# Patient Record
Sex: Male | Born: 1955
Health system: Southern US, Community
[De-identification: ages and names within clinical notes are randomized; demographics above are authoritative.]

## PROBLEM LIST (undated history)

## (undated) DIAGNOSIS — N189 Chronic kidney disease, unspecified: Secondary | ICD-10-CM

## (undated) DIAGNOSIS — R42 Dizziness and giddiness: Secondary | ICD-10-CM

---

## 2007-09-09 ENCOUNTER — Emergency Department: Payer: Self-pay | Admitting: Emergency Medicine

## 2008-12-24 ENCOUNTER — Encounter: Payer: Self-pay | Admitting: Internal Medicine

## 2009-01-06 ENCOUNTER — Encounter: Payer: Self-pay | Admitting: Internal Medicine

## 2009-02-06 ENCOUNTER — Encounter: Payer: Self-pay | Admitting: Internal Medicine

## 2020-04-24 ENCOUNTER — Encounter: Payer: Self-pay | Admitting: Family Medicine

## 2020-04-24 ENCOUNTER — Ambulatory Visit (INDEPENDENT_AMBULATORY_CARE_PROVIDER_SITE_OTHER): Payer: Medicare HMO | Admitting: Family Medicine

## 2020-04-24 ENCOUNTER — Other Ambulatory Visit: Payer: Self-pay

## 2020-04-24 VITALS — BP 150/86 | HR 81 | Ht 79.0 in | Wt 272.0 lb

## 2020-04-24 DIAGNOSIS — Z7689 Persons encountering health services in other specified circumstances: Secondary | ICD-10-CM

## 2020-04-24 DIAGNOSIS — I10 Essential (primary) hypertension: Secondary | ICD-10-CM | POA: Diagnosis not present

## 2020-04-24 MED ORDER — CHLORTHALIDONE 25 MG PO TABS
25.0000 mg | ORAL_TABLET | Freq: Every day | ORAL | 0 refills | Status: DC
Start: 2020-04-24 — End: 2020-06-05

## 2020-04-24 NOTE — Progress Notes (Signed)
Date:  04/24/2020   Name:  Austin Dudley   DOB:  January 02, 1956   MRN:  [id number]   Chief Complaint: Establish Care (needs physical )  Patient is a 64 year old male who presents for a establish care exam. The patient reports the following problems: up to date.[facility] maintenance has been reviewed up to date.   No results found for: CREATININE, BUN, NA, K, CL, CO2 No results found for: CHOL, HDL, LDLCALC, LDLDIRECT, TRIG, CHOLHDL No results found for: TSH No results found for: HGBA1C No results found for: WBC, HGB, HCT, MCV, PLT No results found for: ALT, AST, GGT, ALKPHOS, BILITOT   Review of Systems  Constitutional: Negative for chills and fever.  HENT: Negative for drooling, ear discharge, ear pain and sore throat.   Respiratory: Negative for cough, shortness of breath and wheezing.   Cardiovascular: Negative for chest pain, palpitations and leg swelling.  Gastrointestinal: Negative for abdominal pain, blood in stool, constipation, diarrhea and nausea.  Endocrine: Negative for polydipsia.  Genitourinary: Negative for dysuria, frequency, hematuria and urgency.  Musculoskeletal: Negative for back pain, myalgias and neck pain.  Skin: Negative for rash.  Allergic/Immunologic: Negative for environmental allergies.  Neurological: Negative for dizziness and headaches.  Hematological: Does not bruise/bleed easily.  Psychiatric/Behavioral: Negative for suicidal ideas. The patient is not nervous/anxious.     There are no problems to display for this patient.   Allergies  Allergen Reactions  . Amoxicillin Rash    History reviewed. No pertinent surgical history.  Social History   Tobacco Use  . Smoking status: Current Every Day Smoker    Packs/day: 1.00    Years: 20.00    Pack years: 20.00    Types: Cigarettes  . Smokeless tobacco: Never Used  Vaping Use  . Vaping Use: Never used  Substance Use Topics  . Alcohol use: Not Currently  . Drug use: Not Currently      Medication list has been reviewed and updated.  No outpatient medications have been marked as taking for the 04/24/20 encounter (Office Visit) with Juline Patch, MD.    [facility] 2/9 Scores 04/24/2020  PHQ - 2 Score 0  PHQ- 9 Score 0    GAD 7 : Generalized Anxiety Score 04/24/2020  Nervous, Anxious, on Edge 0  Control/stop worrying 0  Worry too much - different things 0  Trouble relaxing 0  Restless 0  Easily annoyed or irritable 0  Afraid - awful might happen 0  Total GAD 7 Score 0  Anxiety Difficulty Not difficult at all    BP Readings from Last 3 Encounters:  04/24/20 (!) 150/86    Physical Exam Vitals and nursing note reviewed.  Constitutional:      Appearance: Normal appearance. He is well-developed and overweight.  HENT:     Head: Normocephalic.     Right Ear: Tympanic membrane, ear canal and external ear normal. There is no impacted cerumen.     Left Ear: Tympanic membrane, ear canal and external ear normal. There is no impacted cerumen.     Nose: Nose normal.     Mouth/Throat:     Mouth: Mucous membranes are moist.  Eyes:     General: No scleral icterus.       Right eye: No discharge.        Left eye: No discharge.     Conjunctiva/sclera: Conjunctivae normal.     Pupils: Pupils are equal, round, and reactive to light.  Neck:  Thyroid: No thyromegaly.     Vascular: No JVD.     Trachea: No tracheal deviation.  Cardiovascular:     Rate and Rhythm: Normal rate and regular rhythm.     Heart sounds: Normal heart sounds. No murmur heard.  No friction rub. No gallop.   Pulmonary:     Effort: No respiratory distress.     Breath sounds: Normal breath sounds. No wheezing, rhonchi or rales.  Abdominal:     General: Bowel sounds are normal. There is no distension.     Palpations: Abdomen is soft. There is no mass.     Tenderness: There is no abdominal tenderness. There is no right CVA tenderness, left CVA tenderness, guarding or rebound.     Hernia: No hernia  is present.  Musculoskeletal:        General: No tenderness. Normal range of motion.     Cervical back: Normal range of motion and neck supple.  Lymphadenopathy:     Cervical: No cervical adenopathy.  Skin:    General: Skin is warm.     Findings: No rash.  Neurological:     Mental Status: He is alert and oriented to person, place, and time.     Cranial Nerves: No cranial nerve deficit.     Deep Tendon Reflexes: Reflexes are normal and symmetric.     Wt Readings from Last 3 Encounters:  04/24/20 272 lb (123.4 kg)    BP (!) 150/86   Pulse 81   Ht 6\' 7"  (2.007 m)   Wt 272 lb (123.4 kg)   SpO2 99%   BMI 30.64 kg/m   Assessment and Plan:   1. Establishing care with new doctor, encounter for Patient presents today to establish care with new physician. Patient used to see a physician in the [place] area who has since retired. Otherwise most of his medical care has been "on the road ". Patient's chart was reviewed but there is no information. Patient states he is always had some elevated blood pressure readings.  2. Essential hypertension Patient states he is going to need a DOT physical and it has been noted that blood pressure has been a concern in the past. Today's reading is elevated at 150/86. We will initiate chlorthalidone at 25 mg once a day. And patient has been instructed to return in 6 weeks for recheck of blood pressure. Patient has been given information to go for his DOT physical next care urgent care on [address]

## 2020-06-05 ENCOUNTER — Encounter: Payer: Self-pay | Admitting: Family Medicine

## 2020-06-05 ENCOUNTER — Ambulatory Visit (INDEPENDENT_AMBULATORY_CARE_PROVIDER_SITE_OTHER): Payer: Medicare HMO | Admitting: Family Medicine

## 2020-06-05 ENCOUNTER — Other Ambulatory Visit: Payer: Self-pay

## 2020-06-05 VITALS — BP 120/62 | HR 80 | Ht 79.0 in | Wt 277.0 lb

## 2020-06-05 DIAGNOSIS — F17210 Nicotine dependence, cigarettes, uncomplicated: Secondary | ICD-10-CM | POA: Diagnosis not present

## 2020-06-05 DIAGNOSIS — E669 Obesity, unspecified: Secondary | ICD-10-CM

## 2020-06-05 DIAGNOSIS — Z1211 Encounter for screening for malignant neoplasm of colon: Secondary | ICD-10-CM

## 2020-06-05 DIAGNOSIS — I10 Essential (primary) hypertension: Secondary | ICD-10-CM | POA: Diagnosis not present

## 2020-06-05 DIAGNOSIS — Z122 Encounter for screening for malignant neoplasm of respiratory organs: Secondary | ICD-10-CM | POA: Diagnosis not present

## 2020-06-05 MED ORDER — CHLORTHALIDONE 25 MG PO TABS: 25.0000 mg | ORAL_TABLET | Freq: Every day | ORAL | 1 refills | Status: DC

## 2020-06-05 NOTE — Progress Notes (Signed)
Date:  06/05/2020   Name:  Austin Dudley   DOB:  [birth year]   MRN:  [id number]   Chief Complaint: Hypertension (follow up starting b/p med)  Hypertension This is a chronic problem. The current episode started more than 1 year ago. The problem has been gradually improving since onset. The problem is controlled. Pertinent negatives include no anxiety, chest pain, headaches, neck pain, palpitations, peripheral edema, shortness of breath or sweats. There are no associated agents to hypertension.    No results found for: CREATININE, BUN, NA, K, CL, CO2 No results found for: CHOL, HDL, LDLCALC, LDLDIRECT, TRIG, CHOLHDL No results found for: TSH No results found for: HGBA1C No results found for: WBC, HGB, HCT, MCV, PLT No results found for: ALT, AST, GGT, ALKPHOS, BILITOT   Review of Systems  Constitutional: Negative for chills and fever.  HENT: Negative for drooling, ear discharge, ear pain and sore throat.   Respiratory: Negative for cough, shortness of breath and wheezing.   Cardiovascular: Negative for chest pain, palpitations and leg swelling.  Gastrointestinal: Negative for abdominal pain, blood in stool, constipation, diarrhea and nausea.  Endocrine: Negative for polydipsia.  Genitourinary: Negative for dysuria, frequency, hematuria and urgency.  Musculoskeletal: Negative for back pain, myalgias and neck pain.  Skin: Negative for rash.  Allergic/Immunologic: Negative for environmental allergies.  Neurological: Negative for dizziness and headaches.  Hematological: Does not bruise/bleed easily.  Psychiatric/Behavioral: Negative for suicidal ideas. The patient is not nervous/anxious.     There are no problems to display for this patient.   Allergies  Allergen Reactions  . Amoxicillin Rash    No past surgical history on file.  Social History   Tobacco Use  . Smoking status: Current Every Day Smoker    Packs/day: 1.00    Years: 20.00    Pack years: 20.00    Types:  Cigarettes  . Smokeless tobacco: Never Used  Vaping Use  . Vaping Use: Never used  Substance Use Topics  . Alcohol use: Not Currently  . Drug use: Not Currently     Medication list has been reviewed and updated.  Current Meds  Medication Sig  . chlorthalidone (HYGROTON) 25 MG tablet Take 1 tablet (25 mg total) by mouth daily.    PHQ 2/9 Scores 04/24/2020  PHQ - 2 Score 0  PHQ- 9 Score 0    GAD 7 : Generalized Anxiety Score 04/24/2020  Nervous, Anxious, on Edge 0  Control/stop worrying 0  Worry too much - different things 0  Trouble relaxing 0  Restless 0  Easily annoyed or irritable 0  Afraid - awful might happen 0  Total GAD 7 Score 0  Anxiety Difficulty Not difficult at all    BP Readings from Last 3 Encounters:  06/05/20 (!) 120/62  04/24/20 (!) 150/86    Physical Exam Vitals and nursing note reviewed.  HENT:     Head: Normocephalic.     Right Ear: Tympanic membrane and external ear normal.     Left Ear: Tympanic membrane and external ear normal.     Nose: Nose normal.  Eyes:     General: No scleral icterus.       Right eye: No discharge.        Left eye: No discharge.     Conjunctiva/sclera: Conjunctivae normal.     Pupils: Pupils are equal, round, and reactive to light.  Neck:     Thyroid: No thyromegaly.     Vascular: No JVD.  Trachea: No tracheal deviation.  Cardiovascular:     Rate and Rhythm: Normal rate and regular rhythm.     Heart sounds: Normal heart sounds. No murmur heard.  No friction rub. No gallop.   Pulmonary:     Effort: No respiratory distress.     Breath sounds: Normal breath sounds. No wheezing or rales.  Abdominal:     General: Bowel sounds are normal.     Palpations: Abdomen is soft. There is no mass.     Tenderness: There is no abdominal tenderness. There is no right CVA tenderness, left CVA tenderness, guarding or rebound.  Musculoskeletal:        General: No tenderness. Normal range of motion.     Cervical back: Normal  range of motion and neck supple.  Lymphadenopathy:     Cervical: No cervical adenopathy.  Skin:    General: Skin is warm.     Findings: No rash.  Neurological:     Mental Status: He is alert and oriented to person, place, and time.     Cranial Nerves: No cranial nerve deficit.     Deep Tendon Reflexes: Reflexes are normal and symmetric.     Wt Readings from Last 3 Encounters:  06/05/20 (!) 277 lb (125.6 kg)  04/24/20 272 lb (123.4 kg)    BP (!) 120/62   Pulse 80   Ht 6\' 7"  (2.007 m)   Wt (!) 277 lb (125.6 kg)   BMI 31.21 kg/m   Assessment and Plan:  1. Essential hypertension Chronic.  Controlled.  Stable.  Uncomplicated.  We will continue chlorthalidone 25 mg once a day.  Patient was also given a Dash diet. - chlorthalidone (HYGROTON) 25 MG tablet; Take 1 tablet (25 mg total) by mouth daily.  Dispense: 90 tablet; Refill: 1 - Renal Function Panel  2. Obesity (BMI 30.0-34.9) Health risks of being over weight were discussed and patient was counseled on weight loss options and exercise.  Patient was given a Dash diet for weight loss and for blood pressure control. - Lipid Panel With LDL/HDL Ratio  3. Cigarette nicotine dependence without complication Patient has been advised of the health risks of smoking and counseled concerning cessation of tobacco products. I spent over 3 minutes for discussion and to answer questions.  Patient has been scheduled for low-dose CT scanning of the lung  4. Encounter for screening for lung cancer Referral was placed for [facility] for evaluation with low-dose CT of the lungs for cancer concerns.  5. Colon cancer screening Discussed with patient and referral made to gastroenterology for screening colonoscopy. - Ambulatory referral to Gastroenterology

## 2020-06-05 NOTE — Patient Instructions (Signed)
DASH Eating Plan DASH stands for "Dietary Approaches to Stop Hypertension." The DASH eating plan is a healthy eating plan that has been shown to reduce high blood pressure (hypertension). It may also reduce your risk for type 2 diabetes, heart disease, and stroke. The DASH eating plan may also help with weight loss. What are tips for following this plan?  General guidelines  Avoid eating more than 2,300 mg (milligrams) of salt (sodium) a day. If you have hypertension, you may need to reduce your sodium intake to 1,500 mg a day.  Limit alcohol intake to no more than 1 drink a day for nonpregnant women and 2 drinks a day for men. One drink equals 12 oz of beer, 5 oz of wine, or 1 oz of hard liquor.  Work with your health care provider to maintain a healthy body weight or to lose weight. Ask what an ideal weight is for you.  Get at least 30 minutes of exercise that causes your heart to beat faster (aerobic exercise) most days of the week. Activities may include walking, swimming, or biking.  Work with your health care provider or diet and nutrition specialist (dietitian) to adjust your eating plan to your individual calorie needs. Reading food labels   Check food labels for the amount of sodium per serving. Choose foods with less than 5 percent of the Daily Value of sodium. Generally, foods with less than 300 mg of sodium per serving fit into this eating plan.  To find whole grains, look for the word "whole" as the first word in the ingredient list. Shopping  Buy products labeled as "low-sodium" or "no salt added."  Buy fresh foods. Avoid canned foods and premade or frozen meals. Cooking  Avoid adding salt when cooking. Use salt-free seasonings or herbs instead of table salt or sea salt. Check with your health care provider or pharmacist before using salt substitutes.  Do not fry foods. Cook foods using healthy methods such as baking, boiling, grilling, and broiling instead.  Cook with  heart-healthy oils, such as olive, canola, soybean, or sunflower oil. Meal planning  Eat a balanced diet that includes: ? 5 or more servings of fruits and vegetables each day. At each meal, try to fill half of your plate with fruits and vegetables. ? Up to 6-8 servings of whole grains each day. ? Less than 6 oz of lean meat, poultry, or fish each day. A 3-oz serving of meat is about the same size as a deck of cards. One egg equals 1 oz. ? 2 servings of low-fat dairy each day. ? A serving of nuts, seeds, or beans 5 times each week. ? Heart-healthy fats. Healthy fats called Omega-3 fatty acids are found in foods such as flaxseeds and coldwater fish, like sardines, salmon, and mackerel.  Limit how much you eat of the following: ? Canned or prepackaged foods. ? Food that is high in trans fat, such as fried foods. ? Food that is high in saturated fat, such as fatty meat. ? Sweets, desserts, sugary drinks, and other foods with added sugar. ? Full-fat dairy products.  Do not salt foods before eating.  Try to eat at least 2 vegetarian meals each week.  Eat more home-cooked food and less restaurant, buffet, and fast food.  When eating at a restaurant, ask that your food be prepared with less salt or no salt, if possible. What foods are recommended? The items listed may not be a complete list. Talk with your dietitian about   what dietary choices are best for you. Grains Whole-grain or whole-wheat bread. Whole-grain or whole-wheat pasta. Brown rice. Oatmeal. Quinoa. Bulgur. Whole-grain and low-sodium cereals. Pita bread. Low-fat, low-sodium crackers. Whole-wheat flour tortillas. Vegetables Fresh or frozen vegetables (raw, steamed, roasted, or grilled). Low-sodium or reduced-sodium tomato and vegetable juice. Low-sodium or reduced-sodium tomato sauce and tomato paste. Low-sodium or reduced-sodium canned vegetables. Fruits All fresh, dried, or frozen fruit. Canned fruit in natural juice (without  added sugar). Meat and other protein foods Skinless chicken or turkey. Ground chicken or turkey. Pork with fat trimmed off. Fish and seafood. Egg whites. Dried beans, peas, or lentils. Unsalted nuts, nut butters, and seeds. Unsalted canned beans. Lean cuts of beef with fat trimmed off. Low-sodium, lean deli meat. Dairy Low-fat (1%) or fat-free (skim) milk. Fat-free, low-fat, or reduced-fat cheeses. Nonfat, low-sodium ricotta or cottage cheese. Low-fat or nonfat yogurt. Low-fat, low-sodium cheese. Fats and oils Soft margarine without trans fats. Vegetable oil. Low-fat, reduced-fat, or light mayonnaise and salad dressings (reduced-sodium). Canola, safflower, olive, soybean, and sunflower oils. Avocado. Seasoning and other foods Herbs. Spices. Seasoning mixes without salt. Unsalted popcorn and pretzels. Fat-free sweets. What foods are not recommended? The items listed may not be a complete list. Talk with your dietitian about what dietary choices are best for you. Grains Baked goods made with fat, such as croissants, muffins, or some breads. Dry pasta or rice meal packs. Vegetables Creamed or fried vegetables. Vegetables in a cheese sauce. Regular canned vegetables (not low-sodium or reduced-sodium). Regular canned tomato sauce and paste (not low-sodium or reduced-sodium). Regular tomato and vegetable juice (not low-sodium or reduced-sodium). Pickles. Olives. Fruits Canned fruit in a light or heavy syrup. Fried fruit. Fruit in cream or butter sauce. Meat and other protein foods Fatty cuts of meat. Ribs. Fried meat. Bacon. Sausage. Bologna and other processed lunch meats. Salami. Fatback. Hotdogs. Bratwurst. Salted nuts and seeds. Canned beans with added salt. Canned or smoked fish. Whole eggs or egg yolks. Chicken or turkey with skin. Dairy Whole or 2% milk, cream, and half-and-half. Whole or full-fat cream cheese. Whole-fat or sweetened yogurt. Full-fat cheese. Nondairy creamers. Whipped toppings.  Processed cheese and cheese spreads. Fats and oils Butter. Stick margarine. Lard. Shortening. Ghee. Bacon fat. Tropical oils, such as coconut, palm kernel, or palm oil. Seasoning and other foods Salted popcorn and pretzels. Onion salt, garlic salt, seasoned salt, table salt, and sea salt. Worcestershire sauce. Tartar sauce. Barbecue sauce. Teriyaki sauce. Soy sauce, including reduced-sodium. Steak sauce. Canned and packaged gravies. Fish sauce. Oyster sauce. Cocktail sauce. Horseradish that you find on the shelf. Ketchup. Mustard. Meat flavorings and tenderizers. Bouillon cubes. Hot sauce and Tabasco sauce. Premade or packaged marinades. Premade or packaged taco seasonings. Relishes. Regular salad dressings. Where to find more information:  National Heart, Lung, and Blood Institute: www.nhlbi.nih.gov  American Heart Association: www.heart.org Summary  The DASH eating plan is a healthy eating plan that has been shown to reduce high blood pressure (hypertension). It may also reduce your risk for type 2 diabetes, heart disease, and stroke.  With the DASH eating plan, you should limit salt (sodium) intake to 2,300 mg a day. If you have hypertension, you may need to reduce your sodium intake to 1,500 mg a day.  When on the DASH eating plan, aim to eat more fresh fruits and vegetables, whole grains, lean proteins, low-fat dairy, and heart-healthy fats.  Work with your health care provider or diet and nutrition specialist (dietitian) to adjust your eating plan to your   individual calorie needs. This information is not intended to replace advice given to you by your health care provider. Make sure you discuss any questions you have with your health care provider. Document Revised: 10/07/2017 Document Reviewed: 10/18/2016 Elsevier Patient Education  2020 [employer].  

## 2020-06-06 LAB — RENAL FUNCTION PANEL
Albumin: 4.4 g/dL (ref 3.8–4.8)
BUN/Creatinine Ratio: 10 (ref 10–24)
BUN: 23 mg/dL (ref 8–27)
CO2: 25 mmol/L (ref 20–29)
Calcium: 9.8 mg/dL (ref 8.6–10.2)
Chloride: 93 mmol/L — ABNORMAL LOW (ref 96–106)
Creatinine, Ser: 2.26 mg/dL — ABNORMAL HIGH (ref 0.76–1.27)
GFR calc Af Amer: 34 mL/min/{1.73_m2} — ABNORMAL LOW (ref >59)
GFR calc non Af Amer: 29 mL/min/{1.73_m2} — ABNORMAL LOW (ref >59)
Glucose: 177 mg/dL — ABNORMAL HIGH (ref 65–99)
Phosphorus: 2.1 mg/dL — ABNORMAL LOW (ref 2.8–4.1)
Potassium: 3.4 mmol/L — ABNORMAL LOW (ref 3.5–5.2)
Sodium: 137 mmol/L (ref 134–144)

## 2020-06-06 LAB — LIPID PANEL WITH LDL/HDL RATIO
Cholesterol, Total: 178 mg/dL (ref 100–199)
HDL: 30 mg/dL — ABNORMAL LOW (ref >39)
LDL Chol Calc (NIH): 123 mg/dL — ABNORMAL HIGH (ref 0–99)
LDL/HDL Ratio: 4.1 ratio — ABNORMAL HIGH (ref 0.0–3.6)
Triglycerides: 138 mg/dL (ref 0–149)
VLDL Cholesterol Cal: 25 mg/dL (ref 5–40)

## 2020-06-12 ENCOUNTER — Telehealth: Payer: Self-pay | Admitting: *Deleted

## 2020-06-12 NOTE — Telephone Encounter (Signed)
Received referral for low dose lung cancer screening CT scan. Message left at phone number listed in EMR for patient to call me back to facilitate scheduling scan.  

## 2020-07-07 ENCOUNTER — Telehealth: Payer: Self-pay

## 2020-07-07 ENCOUNTER — Telehealth: Payer: Medicare HMO

## 2020-07-07 NOTE — Telephone Encounter (Signed)
Patient missed his 9:30 nurse call to schedule his colonoscopy.  LVM for him to call office back to reschedule this call.  Thanks,  [place], [place]

## 2020-07-16 ENCOUNTER — Telehealth: Payer: Self-pay

## 2020-07-16 DIAGNOSIS — Z122 Encounter for screening for malignant neoplasm of respiratory organs: Secondary | ICD-10-CM

## 2020-07-16 DIAGNOSIS — Z87891 Personal history of nicotine dependence: Secondary | ICD-10-CM

## 2020-07-16 NOTE — Telephone Encounter (Signed)
Contacted patient for lung CT screening clinic after receiving referral from Dr. Otilio Miu.  Message left for patient to call Burgess Estelle, lung navigator to schedule CT scan.

## 2020-07-28 ENCOUNTER — Encounter: Payer: Self-pay | Admitting: Family Medicine

## 2020-07-28 ENCOUNTER — Ambulatory Visit (INDEPENDENT_AMBULATORY_CARE_PROVIDER_SITE_OTHER): Payer: Medicare HMO | Admitting: Family Medicine

## 2020-07-28 ENCOUNTER — Other Ambulatory Visit: Payer: Self-pay

## 2020-07-28 VITALS — BP 120/78 | HR 88 | Ht 78.0 in | Wt 258.0 lb

## 2020-07-28 DIAGNOSIS — R739 Hyperglycemia, unspecified: Secondary | ICD-10-CM

## 2020-07-28 DIAGNOSIS — R5381 Other malaise: Secondary | ICD-10-CM | POA: Diagnosis not present

## 2020-07-28 DIAGNOSIS — Z862 Personal history of diseases of the blood and blood-forming organs and certain disorders involving the immune mechanism: Secondary | ICD-10-CM

## 2020-07-28 DIAGNOSIS — R5383 Other fatigue: Secondary | ICD-10-CM

## 2020-07-28 DIAGNOSIS — R634 Abnormal weight loss: Secondary | ICD-10-CM

## 2020-07-28 DIAGNOSIS — F17210 Nicotine dependence, cigarettes, uncomplicated: Secondary | ICD-10-CM

## 2020-07-28 DIAGNOSIS — N1832 Chronic kidney disease, stage 3b: Secondary | ICD-10-CM | POA: Diagnosis not present

## 2020-07-28 LAB — HEMOCCULT GUIAC POC 1CARD (OFFICE): Fecal Occult Blood, POC: NEGATIVE

## 2020-07-28 LAB — POCT CBG (FASTING - GLUCOSE)-MANUAL ENTRY: Glucose Fasting, POC: 197 mg/dL — AB (ref 70–99)

## 2020-07-28 NOTE — Progress Notes (Signed)
Date:  07/28/2020   Name:  Austin Dudley   DOB:  May 13, 1956   MRN:  [id number]   Chief Complaint: Dizziness (weight loss of 20 pounds since july- not trying. Feels bad, not sleeping, no appetite, gets dizzy when trying to do anything)  Dizziness This is a new problem. The current episode started 1 to 4 weeks ago. The problem has been gradually worsening. Associated symptoms include a change in bowel habit, fatigue, nausea and vertigo. Pertinent negatives include no abdominal pain, chest pain, chills, coughing, diaphoresis, fever, headaches, myalgias, neck pain, rash or sore throat. Exacerbated by: lying down/vertigo. He has tried nothing for the symptoms.  Thyroid Problem Presents for initial (for malaise and fatigue) visit. Symptoms include fatigue and weight loss. Patient reports no anxiety, constipation, diaphoresis, diarrhea or palpitations.    Lab Results  Component Value Date   CREATININE 2.26 (H) 06/05/2020   BUN 23 06/05/2020   NA 137 06/05/2020   K 3.4 (L) 06/05/2020   CL 93 (L) 06/05/2020   CO2 25 06/05/2020   Lab Results  Component Value Date   CHOL 178 06/05/2020   HDL 30 (L) 06/05/2020   LDLCALC 123 (H) 06/05/2020   TRIG 138 06/05/2020   No results found for: TSH No results found for: HGBA1C No results found for: WBC, HGB, HCT, MCV, PLT No results found for: ALT, AST, GGT, ALKPHOS, BILITOT   Review of Systems  Constitutional: Positive for fatigue and weight loss. Negative for chills, diaphoresis and fever.  HENT: Negative for drooling, ear discharge, ear pain and sore throat.   Respiratory: Negative for cough, shortness of breath and wheezing.   Cardiovascular: Negative for chest pain, palpitations and leg swelling.  Gastrointestinal: Positive for change in bowel habit and nausea. Negative for abdominal distention, abdominal pain, anal bleeding, blood in stool, constipation, diarrhea and rectal pain.  Endocrine: Negative for polydipsia.  Genitourinary:  Negative for dysuria, frequency, hematuria and urgency.  Musculoskeletal: Negative for back pain, myalgias and neck pain.  Skin: Negative for color change, pallor and rash.  Allergic/Immunologic: Negative for environmental allergies.  Neurological: Positive for dizziness and vertigo. Negative for headaches.  Hematological: Negative for adenopathy. Does not bruise/bleed easily.  Psychiatric/Behavioral: Negative for suicidal ideas. The patient is not nervous/anxious.     There are no problems to display for this patient.   Allergies  Allergen Reactions   Amoxicillin Rash    No past surgical history on file.  Social History   Tobacco Use   Smoking status: Former Smoker    Packs/day: 1.00    Years: 20.00    Pack years: 20.00    Types: Cigarettes    Quit date: 06/27/2020    Years since quitting: 0.0   Smokeless tobacco: Never Used  Vaping Use   Vaping Use: Never used  Substance Use Topics   Alcohol use: Not Currently   Drug use: Not Currently     Medication list has been reviewed and updated.  Current Meds  Medication Sig   chlorthalidone (HYGROTON) 25 MG tablet Take 1 tablet (25 mg total) by mouth daily.    PHQ 2/9 Scores 07/28/2020 04/24/2020  PHQ - 2 Score 0 0  PHQ- 9 Score 7 0    GAD 7 : Generalized Anxiety Score 07/28/2020 04/24/2020  Nervous, Anxious, on Edge 0 0  Control/stop worrying 0 0  Worry too much - different things 0 0  Trouble relaxing 0 0  Restless 0 0  Easily annoyed or irritable  0 0  Afraid - awful might happen 0 0  Total GAD 7 Score 0 0  Anxiety Difficulty - Not difficult at all    BP Readings from Last 3 Encounters:  07/28/20 120/78  06/05/20 (!) 120/62  04/24/20 (!) 150/86    Physical Exam Vitals reviewed.  Constitutional:      Appearance: He is ill-appearing.  HENT:     Right Ear: Tympanic membrane and ear canal normal.     Left Ear: Tympanic membrane and ear canal normal.  Cardiovascular:     Rate and Rhythm: Normal  rate.     Pulses: Normal pulses.     Heart sounds: Normal heart sounds. No murmur heard.  No gallop.   Pulmonary:     Effort: Pulmonary effort is normal.     Breath sounds: Normal breath sounds. No wheezing or rhonchi.  Abdominal:     General: There is no distension.     Palpations: Abdomen is soft.     Tenderness: There is no abdominal tenderness.  Musculoskeletal:     Cervical back: Normal range of motion.  Skin:    General: Skin is warm.     Capillary Refill: Capillary refill takes less than 2 seconds.     Wt Readings from Last 3 Encounters:  07/28/20 258 lb (117 kg)  06/05/20 (!) 277 lb (125.6 kg)  04/24/20 272 lb (123.4 kg)    BP 120/78    Pulse 88    Ht 6\' 6"  (1.981 m)    Wt 258 lb (117 kg)    BMI 29.81 kg/m   Assessment and Plan: 1. Malaise and fatigue New onset.  Approximately 4 weeks.  Is been exceedingly difficult to contact patient because he does not answer his phone therefore he was not able to be contacted concerning need to see nephrology/A1c check/and lung cancer screening.  We will initiate an evaluation with renal function panel CBC thyroid panel with TSH hepatic panel and A1c.  Patient is to come back in 48 hours we will go over labs and not risk not missing causing the short future in the meantime we have made the following appointments and evaluations below. - Renal Function Panel - CBC with Differential/Platelet - Thyroid Panel With TSH - Hepatic Function Panel (6) - Hemoglobin A1c  2. Stage 3b chronic kidney disease Patient was noted to have a GFR of 39 consistent with CKD 3B.  We will recheck a renal function panel and in the meantime we have made an appointment for October with nephrology with Dr. Holley Raring. - Renal Function Panel - Ambulatory referral to Nephrology  3. Weight loss Since we last saw patient patient has lost another 20 pounds I suspect this may be due to uncontrolled diabetes that we have been trying to get patient to come in for  evaluation.  We will obtain an A1c and pending that we will either determine the direction will take for his diabetic approach. - Renal Function Panel - CBC with Differential/Platelet - Thyroid Panel With TSH - Hepatic Function Panel (6) - Hemoglobin A1c - POCT occult blood stool - POCT CBG (Fasting - Glucose)  4. Hyperglycemia Patient is noted to be in the hyperglycemic but less than 200 mg percent.  As noted we will recheck a glucose a day which was noted to be 197 mg percent.  This is not fasting but certainly and indicative of diabetic concern. - Renal Function Panel - Hemoglobin A1c - POCT CBG (Fasting - Glucose)  5.  History of anemia 2019 there was noted to be a mild decrease in hemoglobin but not hematocrit.  We will check a CBC.  We did a Hemoccult today and it was guaiac negative. - CBC with Differential/Platelet - POCT occult blood stool  6. Cigarette nicotine dependence without complication Patient has been advised of the health risks of smoking and counseled concerning cessation of tobacco products. I spent over 3 minutes for discussion and to answer questions.

## 2020-07-28 NOTE — Telephone Encounter (Signed)
PCP office contacted and assisted with scheduling:  Received referral for initial lung cancer screening scan. Contacted patient and obtained smoking history,(former, quit 06/27/20, 47 pack year) as well as answering questions related to screening process. Patient denies signs of lung cancer such as weight loss or hemoptysis. Patient denies comorbidity that would prevent curative treatment if lung cancer were found. Patient is scheduled for shared decision making visit and CT scan on 08/05/20 at 130pm.

## 2020-07-28 NOTE — Addendum Note (Signed)
Addended by: Lieutenant Diego on: 07/28/2020 10:43 AM   Modules accepted: Orders

## 2020-07-29 LAB — CBC WITH DIFFERENTIAL/PLATELET
Basophils Absolute: 0 10*3/uL (ref 0.0–0.2)
Basos: 0 %
EOS (ABSOLUTE): 0.1 10*3/uL (ref 0.0–0.4)
Eos: 1 %
Hematocrit: 35.5 % — ABNORMAL LOW (ref 37.5–51.0)
Hemoglobin: 12.8 g/dL — ABNORMAL LOW (ref 13.0–17.7)
Immature Grans (Abs): 0 10*3/uL (ref 0.0–0.1)
Immature Granulocytes: 0 %
Lymphocytes Absolute: 2.2 10*3/uL (ref 0.7–3.1)
Lymphs: 25 %
MCH: 30.6 pg (ref 26.6–33.0)
MCHC: 36.1 g/dL — ABNORMAL HIGH (ref 31.5–35.7)
MCV: 85 fL (ref 79–97)
Monocytes Absolute: 0.5 10*3/uL (ref 0.1–0.9)
Monocytes: 6 %
Neutrophils Absolute: 6 10*3/uL (ref 1.4–7.0)
Neutrophils: 68 %
Platelets: 302 10*3/uL (ref 150–450)
RBC: 4.18 x10E6/uL (ref 4.14–5.80)
RDW: 13.9 % (ref 11.6–15.4)
WBC: 8.9 10*3/uL (ref 3.4–10.8)

## 2020-07-29 LAB — RENAL FUNCTION PANEL
Albumin: 4.8 g/dL (ref 3.8–4.8)
BUN/Creatinine Ratio: 10 (ref 10–24)
BUN: 24 mg/dL (ref 8–27)
CO2: 27 mmol/L (ref 20–29)
Calcium: 9.9 mg/dL (ref 8.6–10.2)
Chloride: 88 mmol/L — ABNORMAL LOW (ref 96–106)
Creatinine, Ser: 2.47 mg/dL — ABNORMAL HIGH (ref 0.76–1.27)
GFR calc Af Amer: 30 mL/min/{1.73_m2} — ABNORMAL LOW (ref >59)
GFR calc non Af Amer: 26 mL/min/{1.73_m2} — ABNORMAL LOW (ref >59)
Glucose: 150 mg/dL — ABNORMAL HIGH (ref 65–99)
Phosphorus: 2 mg/dL — ABNORMAL LOW (ref 2.8–4.1)
Potassium: 3.3 mmol/L — ABNORMAL LOW (ref 3.5–5.2)
Sodium: 137 mmol/L (ref 134–144)

## 2020-07-29 LAB — THYROID PANEL WITH TSH
Free Thyroxine Index: 3.5 (ref 1.2–4.9)
T3 Uptake Ratio: 28 % (ref 24–39)
T4, Total: 12.4 ug/dL — ABNORMAL HIGH (ref 4.5–12.0)
TSH: 1.82 u[IU]/mL (ref 0.450–4.500)

## 2020-07-29 LAB — HEPATIC FUNCTION PANEL (6)
ALT: 25 IU/L (ref 0–44)
AST: 30 IU/L (ref 0–40)
Alkaline Phosphatase: 79 IU/L (ref 44–121)
Bilirubin Total: 1.1 mg/dL (ref 0.0–1.2)
Bilirubin, Direct: 0.42 mg/dL — ABNORMAL HIGH (ref 0.00–0.40)

## 2020-07-29 LAB — HEMOGLOBIN A1C
Est. average glucose Bld gHb Est-mCnc: 137 mg/dL
Hgb A1c MFr Bld: 6.4 % — ABNORMAL HIGH (ref 4.8–5.6)

## 2020-07-30 ENCOUNTER — Other Ambulatory Visit: Payer: Self-pay

## 2020-07-30 ENCOUNTER — Encounter: Payer: Self-pay | Admitting: Family Medicine

## 2020-07-30 ENCOUNTER — Ambulatory Visit (INDEPENDENT_AMBULATORY_CARE_PROVIDER_SITE_OTHER): Payer: Medicare HMO | Admitting: Family Medicine

## 2020-07-30 ENCOUNTER — Telehealth: Payer: Self-pay

## 2020-07-30 VITALS — BP 120/78 | HR 88 | Ht 78.0 in | Wt 259.0 lb

## 2020-07-30 DIAGNOSIS — R0600 Dyspnea, unspecified: Secondary | ICD-10-CM | POA: Diagnosis not present

## 2020-07-30 DIAGNOSIS — F17210 Nicotine dependence, cigarettes, uncomplicated: Secondary | ICD-10-CM

## 2020-07-30 DIAGNOSIS — R5383 Other fatigue: Secondary | ICD-10-CM | POA: Diagnosis not present

## 2020-07-30 DIAGNOSIS — R634 Abnormal weight loss: Secondary | ICD-10-CM

## 2020-07-30 DIAGNOSIS — R5381 Other malaise: Secondary | ICD-10-CM

## 2020-07-30 DIAGNOSIS — R0609 Other forms of dyspnea: Secondary | ICD-10-CM

## 2020-07-30 DIAGNOSIS — N1832 Chronic kidney disease, stage 3b: Secondary | ICD-10-CM

## 2020-07-30 MED ORDER — NICOTINE 21 MG/24HR TD PT24: 21.0000 mg | MEDICATED_PATCH | Freq: Every day | TRANSDERMAL | 0 refills | Status: DC

## 2020-07-30 NOTE — Telephone Encounter (Signed)
Returned patients call.  No answer.  Unable to leave voicemail.

## 2020-07-30 NOTE — Progress Notes (Signed)
Date:  07/30/2020   Name:  Austin Dudley   DOB:  [birth year]   MRN:  [id number]   Chief Complaint: Follow-up (not taking chlorthaladone/ prediabetes)  Patient is a 64 year old male who presents for a followup exam for malaise/fatigue/weight loss. The patient reports the following problems: seemly better. Health maintenance has been reviewed studies as noted.   Lab Results  Component Value Date   CREATININE 2.47 (H) 07/28/2020   BUN 24 07/28/2020   NA 137 07/28/2020   K 3.3 (L) 07/28/2020   CL 88 (L) 07/28/2020   CO2 27 07/28/2020   Lab Results  Component Value Date   CHOL 178 06/05/2020   HDL 30 (L) 06/05/2020   LDLCALC 123 (H) 06/05/2020   TRIG 138 06/05/2020   Lab Results  Component Value Date   TSH 1.820 07/28/2020   Lab Results  Component Value Date   HGBA1C 6.4 (H) 07/28/2020   Lab Results  Component Value Date   WBC 8.9 07/28/2020   HGB 12.8 (L) 07/28/2020   HCT 35.5 (L) 07/28/2020   MCV 85 07/28/2020   PLT 302 07/28/2020   Lab Results  Component Value Date   ALT 25 07/28/2020   AST 30 07/28/2020   ALKPHOS 79 07/28/2020   BILITOT 1.1 07/28/2020     Review of Systems  Constitutional: Negative for chills and fever.  HENT: Negative for drooling, ear discharge, ear pain and sore throat.   Respiratory: Negative for cough, shortness of breath and wheezing.   Cardiovascular: Negative for chest pain, palpitations and leg swelling.  Gastrointestinal: Negative for abdominal pain, blood in stool, constipation, diarrhea and nausea.  Endocrine: Negative for polydipsia.  Genitourinary: Negative for dysuria, frequency, hematuria and urgency.  Musculoskeletal: Negative for back pain, myalgias and neck pain.  Skin: Negative for rash.  Allergic/Immunologic: Negative for environmental allergies.  Neurological: Negative for dizziness and headaches.  Hematological: Does not bruise/bleed easily.  Psychiatric/Behavioral: Negative for suicidal ideas. The patient is not  nervous/anxious.     There are no problems to display for this patient.   Allergies  Allergen Reactions  . Amoxicillin Rash    No past surgical history on file.  Social History   Tobacco Use  . Smoking status: Former Smoker    Packs/day: 1.00    Years: 20.00    Pack years: 20.00    Types: Cigarettes    Quit date: 06/27/2020    Years since quitting: 0.0  . Smokeless tobacco: Never Used  Vaping Use  . Vaping Use: Never used  Substance Use Topics  . Alcohol use: Not Currently  . Drug use: Not Currently     Medication list has been reviewed and updated.  No outpatient medications have been marked as taking for the 07/30/20 encounter (Office Visit) with Juline Patch, MD.    [facility] 2/9 Scores 07/28/2020 04/24/2020  PHQ - 2 Score 0 0  PHQ- 9 Score 7 0    GAD 7 : Generalized Anxiety Score 07/28/2020 04/24/2020  Nervous, Anxious, on Edge 0 0  Control/stop worrying 0 0  Worry too much - different things 0 0  Trouble relaxing 0 0  Restless 0 0  Easily annoyed or irritable 0 0  Afraid - awful might happen 0 0  Total GAD 7 Score 0 0  Anxiety Difficulty - Not difficult at all    BP Readings from Last 3 Encounters:  07/30/20 120/78  07/28/20 120/78  06/05/20 (!) 120/62  Physical Exam Vitals and nursing note reviewed.  HENT:     Head: Normocephalic.     Right Ear: Tympanic membrane and external ear normal.     Left Ear: Tympanic membrane and external ear normal.     Nose: Nose normal.     Mouth/Throat:     Mouth: Mucous membranes are dry.  Eyes:     General: No scleral icterus.       Right eye: No discharge.        Left eye: No discharge.     Conjunctiva/sclera: Conjunctivae normal.     Pupils: Pupils are equal, round, and reactive to light.  Neck:     Thyroid: No thyromegaly.     Vascular: No JVD.     Trachea: No tracheal deviation.  Cardiovascular:     Rate and Rhythm: Normal rate and regular rhythm.     Heart sounds: Normal heart sounds. No murmur  heard.  No friction rub. No gallop.   Pulmonary:     Effort: No respiratory distress.     Breath sounds: Normal breath sounds. No wheezing or rales.  Abdominal:     General: Bowel sounds are normal.     Palpations: Abdomen is soft. There is no mass.     Tenderness: There is no abdominal tenderness. There is no guarding or rebound.  Musculoskeletal:        General: No tenderness. Normal range of motion.     Cervical back: Normal range of motion and neck supple.  Lymphadenopathy:     Cervical: No cervical adenopathy.  Skin:    General: Skin is warm.     Findings: No rash.  Neurological:     Mental Status: He is alert and oriented to person, place, and time.     Cranial Nerves: No cranial nerve deficit.     Deep Tendon Reflexes: Reflexes are normal and symmetric.     Wt Readings from Last 3 Encounters:  07/30/20 259 lb (117.5 kg)  07/28/20 258 lb (117 kg)  06/05/20 (!) 277 lb (125.6 kg)    BP 120/78   Pulse 88   Ht 6\' 6"  (1.981 m)   Wt 259 lb (117.5 kg)   BMI 29.93 kg/m   Assessment and Plan:  1. Malaise and fatigue Follow-up for malaise and fatigue earlier in the week in which we reviewed labs and patient is having some diabetic but had a very mild and seems to be controlled with diet,.  It was noted that there was a decrease in his GFR but I am beginning to think this may be prerenal due to the chlorthalidone use for blood pressure control.  2. Weight loss Patient had a 20 pound weight loss while on chlorthalidone.  Patient had a very good diuresis but this may have caused some prerenal concerns.  Blood pressure is doing well currently at 120/78.  Patient's been encouraged to drink more fluids such as have cut Gatorade.  We will recheck blood pressure in 6 weeks.  3. Cigarette nicotine dependence without complication Desires help with smoking cessation.  We have discussed nicotine patches and will start with 21 mg per 24-hour patch. - nicotine (NICODERM CQ - DOSED IN  MG/24 HOURS) 21 mg/24hr patch; Place 1 patch (21 mg total) onto the skin daily.  Dispense: 28 patch; Refill: 0  4. DOE (dyspnea on exertion) Patient is noted to had some dyspnea on exertion either from fatigue or other means.  We obtained an EKG to get  a baseline.  EKG reading was as follows.I have reviewed EKG which shows pulse rate 92 and regular intervals normal except for QRS which was increased to .136.  This is consistent with a right bundle branch block for which we have no comparison.  There is no LVH by EKG criteria noted.  There is no ischemic changes except for some nonspecific T wave abnormalities which is associated with the right bundle branch block... Comparison to previous EKG dated not applicable.  Currently did not feel like there is a ischemic cause and we will keep a watch on this. - EKG 12-Lead  5. Stage 3b chronic kidney disease As noted patient's GFR decreased on last visit.  Patient has increased his weight by 1 pound likely due to stopping the chlorthalidone.  We will recheck a BUN and creatinine to see if there is been some improvement in his renal function since cessation of diuresis. - BUN+Creat

## 2020-07-31 LAB — BUN+CREAT
BUN/Creatinine Ratio: 10 (ref 10–24)
BUN: 23 mg/dL (ref 8–27)
Creatinine, Ser: 2.41 mg/dL — ABNORMAL HIGH (ref 0.76–1.27)
GFR calc Af Amer: 31 mL/min/{1.73_m2} — ABNORMAL LOW (ref >59)
GFR calc non Af Amer: 27 mL/min/{1.73_m2} — ABNORMAL LOW (ref >59)

## 2020-08-05 ENCOUNTER — Ambulatory Visit
Admission: RE | Admit: 2020-08-05 | Discharge: 2020-08-05 | Disposition: A | Discharge status: Home / Self Care | Payer: Medicare HMO | Source: Ambulatory Visit | Attending: Nurse Practitioner | Admitting: Nurse Practitioner

## 2020-08-05 ENCOUNTER — Other Ambulatory Visit: Payer: Self-pay

## 2020-08-05 ENCOUNTER — Encounter: Payer: Self-pay | Admitting: Nurse Practitioner

## 2020-08-05 ENCOUNTER — Inpatient Hospital Stay: Payer: Medicare HMO | Attending: Nurse Practitioner | Admitting: Nurse Practitioner

## 2020-08-05 DIAGNOSIS — Z122 Encounter for screening for malignant neoplasm of respiratory organs: Secondary | ICD-10-CM | POA: Diagnosis present

## 2020-08-05 DIAGNOSIS — Z87891 Personal history of nicotine dependence: Secondary | ICD-10-CM

## 2020-08-05 NOTE — Progress Notes (Signed)
Virtual Visit via Video Enabled Telemedicine Note   I connected with Braxxton Stoudt on 08/05/20 at 1:45 PM EST by video enabled telemedicine visit and verified that I am speaking with the correct person using two identifiers.   I discussed the limitations, risks, security and privacy concerns of performing an evaluation and management service by telemedicine and the availability of in-person appointments. I also discussed with the patient that there may be a patient responsible charge related to this service. The patient expressed understanding and agreed to proceed.   Other persons participating in the visit and their role in the encounter: Burgess Estelle, RN- checking in patient & navigation  Patient's location: [place]  Provider's location: Clinic  Chief Complaint: Low Dose CT Screening  Patient agreed to evaluation by telemedicine to discuss shared decision making for consideration of low dose CT lung cancer screening.    In accordance with CMS guidelines, patient has met eligibility criteria including age, absence of signs or symptoms of lung cancer.  Social History   Tobacco Use  . Smoking status: Former Smoker    Packs/day: 1.00    Years: 47.00    Pack years: 47.00    Types: Cigarettes    Quit date: 06/27/2020    Years since quitting: 0.1  . Smokeless tobacco: Never Used  Substance Use Topics  . Alcohol use: Not Currently     A shared decision-making session was conducted prior to the performance of CT scan. This includes one or more decision aids, includes benefits and harms of screening, follow-up diagnostic testing, over-diagnosis, false positive rate, and total radiation exposure.   Counseling on the importance of adherence to annual lung cancer LDCT screening, impact of co-morbidities, and ability or willingness to undergo diagnosis and treatment is imperative for compliance of the program.   Counseling on the importance of continued smoking cessation for former  smokers; the importance of smoking cessation for current smokers, and information about tobacco cessation interventions have been given to patient including [place] and [facility] [employer] [place] programs.   Written order for lung cancer screening with LDCT has been given to the patient and any and all questions have been answered to the best of my abilities.    Yearly follow up will be coordinated by Burgess Estelle, Thoracic Navigator.  I discussed the assessment and treatment plan with the patient. The patient was provided an opportunity to ask questions and all were answered. The patient agreed with the plan and demonstrated an understanding of the instructions.   The patient was advised to call back or seek an in-person evaluation if the symptoms worsen or if the condition fails to improve as anticipated.   I provided 15 minutes of face-to-face video visit time during this encounter, and > 50% was spent counseling as documented under my assessment & plan.   Beckey Rutter, DNP, AGNP-C [place] at [facility] [phone] (clinic)

## 2020-08-07 ENCOUNTER — Encounter: Payer: Self-pay | Admitting: *Deleted

## 2020-08-08 ENCOUNTER — Telehealth: Payer: Self-pay

## 2020-08-08 NOTE — Telephone Encounter (Signed)
Called pt back and left vm to call us back

## 2020-08-08 NOTE — Telephone Encounter (Unsigned)
Copied from [place] [id number]. Topic: General - Other >> Aug 08, 2020  1:06 PM Rainey Pines A wrote: Pt would like to speak with Dr.Jones nurse about not being able to schedule his colonoscpy at the place he was referred to

## 2020-08-11 ENCOUNTER — Other Ambulatory Visit: Payer: Self-pay

## 2020-08-11 ENCOUNTER — Telehealth (INDEPENDENT_AMBULATORY_CARE_PROVIDER_SITE_OTHER): Payer: Medicare HMO | Admitting: Gastroenterology

## 2020-08-11 DIAGNOSIS — Z1211 Encounter for screening for malignant neoplasm of colon: Secondary | ICD-10-CM

## 2020-08-11 DIAGNOSIS — I10 Essential (primary) hypertension: Secondary | ICD-10-CM | POA: Insufficient documentation

## 2020-08-11 DIAGNOSIS — Z720 Tobacco use: Secondary | ICD-10-CM | POA: Insufficient documentation

## 2020-08-11 MED ORDER — NA SULFATE-K SULFATE-MG SULF 17.5-3.13-1.6 GM/177ML PO SOLN: 1.0000 | Freq: Once | ORAL | 0 refills | Status: AC

## 2020-08-11 NOTE — Progress Notes (Signed)
Gastroenterology Pre-Procedure Review  Request Date: Monday 09/01/20 Requesting Physician: Dr. Bonna Gains  PATIENT REVIEW QUESTIONS: The patient responded to the following health history questions as indicated:    1. Are you having any GI issues? no 2. Do you have a personal history of Polyps? no 3. Do you have a family history of Colon Cancer or Polyps? no 4. Diabetes Mellitus? no 5. Joint replacements in the past 12 months?no 6. Major health problems in the past 3 months?no 7. Any artificial heart valves, MVP, or defibrillator?no    MEDICATIONS & ALLERGIES:    Patient reports the following regarding taking any anticoagulation/antiplatelet therapy:   Plavix, Coumadin, Eliquis, Xarelto, Lovenox, Pradaxa, Brilinta, or Effient? no Aspirin? no  Patient confirms/reports the following medications:  Current Outpatient Medications  Medication Sig Dispense Refill  . Na Sulfate-K Sulfate-Mg Sulf 17.5-3.13-1.6 GM/177ML SOLN Take 1 kit by mouth once for 1 dose. 354 mL 0  . nicotine (NICODERM CQ - DOSED IN MG/24 HOURS) 21 mg/24hr patch Place 1 patch (21 mg total) onto the skin daily. (Patient not taking: Reported on 08/11/2020) 28 patch 0   No current facility-administered medications for this visit.    Patient confirms/reports the following allergies:  Allergies  Allergen Reactions  . Amoxicillin Rash  . Ampicillin Rash    No orders of the defined types were placed in this encounter.   AUTHORIZATION INFORMATION Primary Insurance: 1D#: Group #:  Secondary Insurance: 1D#: Group #:  SCHEDULE INFORMATION: Date: Wed 10/27/21Time: [place]

## 2020-08-12 ENCOUNTER — Other Ambulatory Visit: Payer: Self-pay | Admitting: Nephrology

## 2020-08-12 DIAGNOSIS — N1832 Chronic kidney disease, stage 3b: Secondary | ICD-10-CM

## 2020-08-19 ENCOUNTER — Ambulatory Visit
Admission: RE | Admit: 2020-08-19 | Discharge: 2020-08-19 | Disposition: A | Discharge status: Home / Self Care | Payer: Medicare HMO | Source: Ambulatory Visit | Attending: Nephrology | Admitting: Nephrology

## 2020-08-19 ENCOUNTER — Other Ambulatory Visit: Payer: Self-pay

## 2020-08-19 DIAGNOSIS — N1832 Chronic kidney disease, stage 3b: Secondary | ICD-10-CM | POA: Diagnosis present

## 2020-09-01 ENCOUNTER — Other Ambulatory Visit: Payer: Self-pay

## 2020-09-01 ENCOUNTER — Other Ambulatory Visit
Admission: RE | Admit: 2020-09-01 | Discharge: 2020-09-01 | Disposition: A | Discharge status: Home / Self Care | Payer: Medicare HMO | Source: Ambulatory Visit | Attending: Gastroenterology | Admitting: Gastroenterology

## 2020-09-01 DIAGNOSIS — Z20822 Contact with and (suspected) exposure to covid-19: Secondary | ICD-10-CM | POA: Diagnosis not present

## 2020-09-01 DIAGNOSIS — Z01812 Encounter for preprocedural laboratory examination: Secondary | ICD-10-CM | POA: Insufficient documentation

## 2020-09-01 LAB — SARS CORONAVIRUS 2 (TAT 6-24 HRS): SARS Coronavirus 2: NEGATIVE

## 2020-09-03 ENCOUNTER — Other Ambulatory Visit: Payer: Self-pay

## 2020-09-03 ENCOUNTER — Encounter
Admission: RE | Disposition: A | Discharge status: Home / Self Care | Payer: Self-pay | Source: Home / Self Care | Attending: Gastroenterology

## 2020-09-03 ENCOUNTER — Encounter: Payer: Self-pay | Admitting: Gastroenterology

## 2020-09-03 ENCOUNTER — Ambulatory Visit: Payer: Medicare HMO | Admitting: Certified Registered Nurse Anesthetist

## 2020-09-03 ENCOUNTER — Ambulatory Visit
Admission: RE | Admit: 2020-09-03 | Discharge: 2020-09-03 | Disposition: A | Discharge status: Home / Self Care | Payer: Medicare HMO | Attending: Gastroenterology | Admitting: Gastroenterology

## 2020-09-03 DIAGNOSIS — Z1211 Encounter for screening for malignant neoplasm of colon: Secondary | ICD-10-CM

## 2020-09-03 DIAGNOSIS — Z87891 Personal history of nicotine dependence: Secondary | ICD-10-CM | POA: Diagnosis not present

## 2020-09-03 DIAGNOSIS — K635 Polyp of colon: Secondary | ICD-10-CM | POA: Diagnosis not present

## 2020-09-03 HISTORY — DX: Dizziness and giddiness: R42

## 2020-09-03 HISTORY — DX: Chronic kidney disease, unspecified: N18.9

## 2020-09-03 HISTORY — PX: COLONOSCOPY WITH PROPOFOL: SHX5780

## 2020-09-03 SURGERY — COLONOSCOPY WITH PROPOFOL
Anesthesia: General

## 2020-09-03 MED ORDER — GLYCOPYRROLATE 0.2 MG/ML IJ SOLN
INTRAMUSCULAR | Status: AC
  Filled 2020-09-03: qty 1

## 2020-09-03 MED ORDER — LIDOCAINE HCL (CARDIAC) PF 100 MG/5ML IV SOSY
PREFILLED_SYRINGE | INTRAVENOUS | Status: DC | PRN
  Administered 2020-09-03: 50 mg via INTRAVENOUS

## 2020-09-03 MED ORDER — LIDOCAINE HCL (PF) 2 % IJ SOLN
INTRAMUSCULAR | Status: AC
  Filled 2020-09-03: qty 5

## 2020-09-03 MED ORDER — PROPOFOL 10 MG/ML IV BOLUS
INTRAVENOUS | Status: DC | PRN
  Administered 2020-09-03: 60 mg via INTRAVENOUS

## 2020-09-03 MED ORDER — PHENYLEPHRINE HCL (PRESSORS) 10 MG/ML IV SOLN
INTRAVENOUS | Status: DC | PRN
  Administered 2020-09-03 (×3): 100 ug via INTRAVENOUS

## 2020-09-03 MED ORDER — PROPOFOL 500 MG/50ML IV EMUL
INTRAVENOUS | Status: AC
  Filled 2020-09-03: qty 50

## 2020-09-03 MED ORDER — PROPOFOL 500 MG/50ML IV EMUL
INTRAVENOUS | Status: DC | PRN
  Administered 2020-09-03: 200 ug/kg/min via INTRAVENOUS

## 2020-09-03 MED ORDER — SODIUM CHLORIDE 0.9 % IV SOLN: INTRAVENOUS | Status: DC

## 2020-09-03 MED ORDER — PHENYLEPHRINE HCL (PRESSORS) 10 MG/ML IV SOLN
INTRAVENOUS | Status: AC
  Filled 2020-09-03: qty 1

## 2020-09-03 NOTE — H&P (Signed)
Vonda Antigua, MD [address] [place], [place], [place], [address] [id number], [place], [place], [place], [address] Phone: [phone]  Fax: [phone]  Primary Care Physician:  Juline Patch, MD   Pre-Procedure History & Physical: HPI:  Austin Dudley is a 64 y.o. male is here for a colonoscopy.   Past Medical History:  Diagnosis Date  . Chronic kidney disease   . Vertigo     History reviewed. No pertinent surgical history.  Prior to Admission medications   Medication Sig Start Date End Date Taking? Authorizing Provider  aspirin EC 81 MG tablet Take 81 mg by mouth daily. Swallow whole.   Yes [provider]  nicotine (NICODERM CQ - DOSED IN MG/24 HOURS) 21 mg/24hr patch Place 1 patch (21 mg total) onto the skin daily. Patient not taking: Reported on 08/11/2020 07/30/20   Juline Patch, MD    Allergies as of 08/11/2020 - Review Complete 08/11/2020  Allergen Reaction Noted  . Amoxicillin Rash 04/24/2020  . Ampicillin Rash 12/20/2017    Family History  Problem Relation Age of Onset  . Stroke Father   . Diabetes Sister     Social History   Socioeconomic History  . Marital status: Single    Spouse name: Not on file  . Number of children: Not on file  . Years of education: Not on file  . Highest education level: Not on file  Occupational History  . Not on file  Tobacco Use  . Smoking status: Former Smoker    Packs/day: 1.00    Years: 47.00    Pack years: 47.00    Types: Cigarettes    Quit date: 06/27/2020    Years since quitting: 0.1  . Smokeless tobacco: Never Used  Vaping Use  . Vaping Use: Never used  Substance and Sexual Activity  . Alcohol use: Not Currently    Comment: quit at 49yrs  . Drug use: Not Currently  . Sexual activity: Not Currently  Other Topics Concern  . Not on file  Social History Narrative  . Not on file   Social Determinants of Health   Financial Resource Strain:   . Difficulty of Paying Living  Expenses: Not on file  Food Insecurity:   . Worried About Charity fundraiser in the Last Year: Not on file  . Ran Out of Food in the Last Year: Not on file  Transportation Needs:   . Lack of Transportation (Medical): Not on file  . Lack of Transportation (Non-Medical): Not on file  Physical Activity:   . Days of Exercise per Week: Not on file  . Minutes of Exercise per Session: Not on file  Stress:   . Feeling of Stress : Not on file  Social Connections:   . Frequency of Communication with Friends and Family: Not on file  . Frequency of Social Gatherings with Friends and Family: Not on file  . Attends Religious Services: Not on file  . Active Member of Clubs or Organizations: Not on file  . Attends Archivist Meetings: Not on file  . Marital Status: Not on file  Intimate Partner Violence:   . Fear of Current or Ex-Partner: Not on file  . Emotionally Abused: Not on file  . Physically Abused: Not on file  . Sexually Abused: Not on file    Review of Systems: See HPI, otherwise negative ROS  Physical Exam: BP (!) 162/82   Pulse 88   Temp (!) 97.1 F (36.2  C) (Temporal)   Resp 19   Ht 6\' 7"  (2.007 m)   Wt 124.7 kg   SpO2 100%   BMI 30.98 kg/m  General:   Alert,  pleasant and cooperative in NAD Head:  Normocephalic and atraumatic. Neck:  Supple; no masses or thyromegaly. Lungs:  Clear throughout to auscultation, normal respiratory effort.    Heart:  +S1, +S2, Regular rate and rhythm, No edema. Abdomen:  Soft, nontender and nondistended. Normal bowel sounds, without guarding, and without rebound.   Neurologic:  Alert and  oriented x4;  grossly normal neurologically.  Impression/Plan: Austin Dudley is here for a colonoscopy to be performed for average risk screening.  Risks, benefits, limitations, and alternatives regarding  colonoscopy have been reviewed with the patient.  Questions have been answered.  All parties agreeable.   Virgel Manifold, MD   09/03/2020, 9:42 AM

## 2020-09-03 NOTE — Anesthesia Postprocedure Evaluation (Signed)
Anesthesia Post Note  Patient: Austin Dudley  Procedure(s) Performed: COLONOSCOPY WITH PROPOFOL (N/A )  Patient location during evaluation: Endoscopy Anesthesia Type: General Level of consciousness: awake and alert Pain management: pain level controlled Vital Signs Assessment: post-procedure vital signs reviewed and stable Respiratory status: spontaneous breathing, nonlabored ventilation, respiratory function stable and patient connected to nasal cannula oxygen Cardiovascular status: blood pressure returned to baseline and stable Postop Assessment: no apparent nausea or vomiting Anesthetic complications: no   No complications documented.   Last Vitals:  Vitals:   09/03/20 0912 09/03/20 1032  BP: (!) 162/82 (!) 95/55  Pulse: 88   Resp: 19   Temp: (!) 36.2 C   SpO2: 100%     Last Pain:  Vitals:   09/03/20 1040  TempSrc:   PainSc: 0-No pain                 Arita Miss

## 2020-09-03 NOTE — Transfer of Care (Signed)
Immediate Anesthesia Transfer of Care Note  Patient: Austin Dudley  Procedure(s) Performed: COLONOSCOPY WITH PROPOFOL (N/A )  Patient Location: PACU  Anesthesia Type:General  Level of Consciousness: drowsy  Airway & Oxygen Therapy: Patient Spontanous Breathing  Post-op Assessment: Report given to RN and Post -op Vital signs reviewed and stable  Post vital signs: Reviewed and stable  Last Vitals:  Vitals Value Taken Time  BP 95/55 09/03/20 1032  Temp    Pulse 75 09/03/20 1033  Resp 21 09/03/20 1033  SpO2 100 % 09/03/20 1033  Vitals shown include unvalidated device data.  Last Pain:  Vitals:   09/03/20 1030  TempSrc:   PainSc: 0-No pain         Complications: No complications documented.

## 2020-09-03 NOTE — Anesthesia Preprocedure Evaluation (Signed)
Anesthesia Evaluation  Patient identified by MRN, date of birth, ID band Patient awake    Reviewed: Allergy & Precautions, NPO status , Patient's Chart, lab work & pertinent test results  History of Anesthesia Complications Negative for: history of anesthetic complications  Airway Mallampati: III  TM Distance: >3 FB Neck ROM: Full    Dental  (+) Poor Dentition, Missing   Pulmonary neg sleep apnea, neg COPD, Current SmokerPatient did not abstain from smoking.,    Pulmonary exam normal breath sounds clear to auscultation       Cardiovascular Exercise Tolerance: Good METShypertension, (-) CAD and (-) Past MI (-) dysrhythmias  Rhythm:Regular Rate:Normal - Systolic murmurs    Neuro/Psych Residual left arm weakness CVA, Residual Symptoms negative psych ROS   GI/Hepatic neg GERD  ,(+)     (-) substance abuse  ,   Endo/Other  diabetes  Renal/GU CRFRenal disease     Musculoskeletal   Abdominal   Peds  Hematology   Anesthesia Other Findings History reviewed. No pertinent past medical history.  Reproductive/Obstetrics                             Anesthesia Physical Anesthesia Plan  ASA: III  Anesthesia Plan: General   Post-op Pain Management:    Induction: Intravenous  PONV Risk Score and Plan: 1 and Ondansetron, Propofol infusion and TIVA  Airway Management Planned: Nasal Cannula  Additional Equipment: None  Intra-op Plan:   Post-operative Plan:   Informed Consent: I have reviewed the patients History and Physical, chart, labs and discussed the procedure including the risks, benefits and alternatives for the proposed anesthesia with the patient or authorized representative who has indicated his/her understanding and acceptance.     Dental advisory given  Plan Discussed with: CRNA and Surgeon  Anesthesia Plan Comments: (Discussed risks of anesthesia with patient, including  possibility of difficulty with spontaneous ventilation under anesthesia necessitating airway intervention, PONV, and rare risks such as cardiac or respiratory or neurological events. Patient understands.)        Anesthesia Quick Evaluation

## 2020-09-03 NOTE — Op Note (Signed)
[facility] Gastroenterology Patient Name: Garfield Coiner Procedure Date: 09/03/2020 9:48 AM MRN: [id number] Account #: [id number] Date of Birth: 1956/05/20 Admit Type: Outpatient Age: 64 Room: [facility] ENDO ROOM 3 Gender: Male Note Status: Finalized Procedure:             Colonoscopy Indications:           Screening for colorectal malignant neoplasm Providers:             Darshan Solanki B. Bonna Gains MD, MD Referring MD:          Juline Patch, MD (Referring MD) Medicines:             Monitored Anesthesia Care Complications:         No immediate complications. Procedure:             Pre-Anesthesia Assessment:                        - ASA Grade Assessment: II - A patient with mild                         systemic disease.                        - Prior to the procedure, a History and Physical was                         performed, and patient medications, allergies and                         sensitivities were reviewed. The patient's tolerance                         of previous anesthesia was reviewed.                        - The risks and benefits of the procedure and the                         sedation options and risks were discussed with the                         patient. All questions were answered and informed                         consent was obtained.                        - Patient identification and proposed procedure were                         verified prior to the procedure by the physician, the                         nurse, the anesthesiologist, the anesthetist and the                         technician. The procedure was verified in the                         procedure  room.                        After obtaining informed consent, the colonoscope was                         passed under direct vision. Throughout the procedure,                         the patient's blood pressure, pulse, and oxygen                         saturations were  monitored continuously. The                         Colonoscope was introduced through the anus and                         advanced to the the cecum, identified by appendiceal                         orifice and ileocecal valve. The colonoscopy was                         performed with ease. The patient tolerated the                         procedure well. The quality of the bowel preparation                         was fair. Fecal material caused clogging of the                         suction channel repeatedly. Colon cleaned as much as                         possible with water and suctioning. Findings:      The perianal and digital rectal examinations were normal.      A 5 mm polyp was found in the transverse colon. The polyp was sessile.       The polyp was removed with a cold snare. Resection and retrieval were       complete.      The exam was otherwise without abnormality.      The rectum, sigmoid colon, descending colon, transverse colon, ascending       colon and cecum appeared normal.      The retroflexed view of the distal rectum and anal verge was normal and       showed no anal or rectal abnormalities. Impression:            - Preparation of the colon was fair.                        - One 5 mm polyp in the transverse colon, removed with                         a cold snare. Resected and retrieved.                        -  The examination was otherwise normal.                        - The rectum, sigmoid colon, descending colon,                         transverse colon, ascending colon and cecum are normal.                        - The distal rectum and anal verge are normal on                         retroflexion view. Recommendation:        - Await pathology results.                        - Discharge patient to home (with escort).                        - Advance diet as tolerated.                        - Continue present medications.                        -  Repeat colonoscopy in 1 year, with 2 day prep.                        - The findings and recommendations were discussed with                         the patient.                        - The findings and recommendations were discussed with                         the patient's family.                        - Return to primary care physician as previously                         scheduled. Procedure Code(s):     --- Professional ---                        [phone], Colonoscopy, flexible; with removal of                         tumor(s), polyp(s), or other lesion(s) by snare                         technique Diagnosis Code(s):     --- Professional ---                        Z12.11, Encounter for screening for malignant neoplasm                         of colon  K63.5, Polyp of colon CPT copyright 2019 [employer]. All rights reserved. The codes documented in this report are preliminary and upon coder review may  be revised to meet current compliance requirements.  Vonda Antigua, MD Margretta Sidle B. Bonna Gains MD, MD 09/03/2020 10:27:31 AM This report has been signed electronically. Number of Addenda: 0 Note Initiated On: 09/03/2020 9:48 AM Scope Withdrawal Time: 0 hours 12 minutes 35 seconds  Total Procedure Duration: 0 hours 23 minutes 16 seconds  Estimated Blood Loss:  Estimated blood loss: none.      [facility]

## 2020-09-04 ENCOUNTER — Encounter: Payer: Self-pay | Admitting: Gastroenterology

## 2020-09-04 LAB — SURGICAL PATHOLOGY

## 2020-09-09 ENCOUNTER — Encounter: Payer: Self-pay | Admitting: Gastroenterology

## 2020-09-11 ENCOUNTER — Ambulatory Visit: Payer: Medicare HMO | Admitting: Family Medicine

## 2020-09-26 ENCOUNTER — Ambulatory Visit (INDEPENDENT_AMBULATORY_CARE_PROVIDER_SITE_OTHER): Payer: Medicare HMO | Admitting: Family Medicine

## 2020-09-26 ENCOUNTER — Other Ambulatory Visit: Payer: Self-pay

## 2020-09-26 ENCOUNTER — Encounter: Payer: Self-pay | Admitting: Family Medicine

## 2020-09-26 VITALS — BP 138/70 | HR 76 | Ht 79.0 in | Wt 272.0 lb

## 2020-09-26 DIAGNOSIS — R7303 Prediabetes: Secondary | ICD-10-CM | POA: Diagnosis not present

## 2020-09-26 DIAGNOSIS — N1832 Chronic kidney disease, stage 3b: Secondary | ICD-10-CM | POA: Diagnosis not present

## 2020-09-26 DIAGNOSIS — R5381 Other malaise: Secondary | ICD-10-CM

## 2020-09-26 DIAGNOSIS — I10 Essential (primary) hypertension: Secondary | ICD-10-CM | POA: Diagnosis not present

## 2020-09-26 DIAGNOSIS — F17210 Nicotine dependence, cigarettes, uncomplicated: Secondary | ICD-10-CM

## 2020-09-26 DIAGNOSIS — Z862 Personal history of diseases of the blood and blood-forming organs and certain disorders involving the immune mechanism: Secondary | ICD-10-CM

## 2020-09-26 DIAGNOSIS — R5383 Other fatigue: Secondary | ICD-10-CM

## 2020-09-26 MED ORDER — LISINOPRIL 5 MG PO TABS: 5.0000 mg | ORAL_TABLET | Freq: Every day | ORAL | 1 refills | Status: DC

## 2020-09-26 NOTE — Progress Notes (Signed)
Date:  09/26/2020   Name:  Austin Dudley   DOB:  02-Jun-1956   MRN:  [id number]   Chief Complaint: Follow-up (pt fatigue is better)  Patient is a 64 year old male who presents for a follow up malaise and fatigue/CKD exam. The patient reports the following problems: none. Health maintenance has been reviewed PNA.  Hypertension This is a chronic problem. The current episode started more than 1 year ago. The problem has been waxing and waning since onset. The problem is uncontrolled. Pertinent negatives include no anxiety, blurred vision, chest pain, headaches, malaise/fatigue, neck pain, orthopnea, palpitations, peripheral edema, PND, shortness of breath or sweats. ([employer] DOE) Risk factors for coronary artery disease include diabetes mellitus and dyslipidemia.  Diabetes He presents for his follow-up diabetic visit. Diabetes type: prediabetes. His disease course has been stable. Pertinent negatives for hypoglycemia include no confusion, dizziness, headaches, hunger, mood changes, nervousness/anxiousness, pallor, seizures, sleepiness, speech difficulty or sweats. Pertinent negatives for diabetes include no blurred vision, no chest pain and no polydipsia. There are no hypoglycemic complications. There are no diabetic complications. There are no known risk factors for coronary artery disease. He is compliant with treatment some of the time. An ACE inhibitor/angiotensin II receptor blocker is not being taken.    Lab Results  Component Value Date   CREATININE 2.41 (H) 07/30/2020   BUN 23 07/30/2020   NA 137 07/28/2020   K 3.3 (L) 07/28/2020   CL 88 (L) 07/28/2020   CO2 27 07/28/2020   Lab Results  Component Value Date   CHOL 178 06/05/2020   HDL 30 (L) 06/05/2020   LDLCALC 123 (H) 06/05/2020   TRIG 138 06/05/2020   Lab Results  Component Value Date   TSH 1.820 07/28/2020   Lab Results  Component Value Date   HGBA1C 6.4 (H) 07/28/2020   Lab Results  Component Value Date   WBC 8.9  07/28/2020   HGB 12.8 (L) 07/28/2020   HCT 35.5 (L) 07/28/2020   MCV 85 07/28/2020   PLT 302 07/28/2020   Lab Results  Component Value Date   ALT 25 07/28/2020   AST 30 07/28/2020   ALKPHOS 79 07/28/2020   BILITOT 1.1 07/28/2020     Review of Systems  Constitutional: Negative for chills, fever and malaise/fatigue.  HENT: Negative for drooling, ear discharge, ear pain and sore throat.   Eyes: Negative for blurred vision.  Respiratory: Negative for cough, shortness of breath and wheezing.   Cardiovascular: Negative for chest pain, palpitations, orthopnea, leg swelling and PND.  Gastrointestinal: Negative for abdominal pain, blood in stool, constipation, diarrhea and nausea.  Endocrine: Negative for polydipsia.  Genitourinary: Negative for dysuria, frequency, hematuria and urgency.  Musculoskeletal: Negative for back pain, myalgias and neck pain.  Skin: Negative for pallor and rash.  Allergic/Immunologic: Negative for environmental allergies.  Neurological: Negative for dizziness, seizures, speech difficulty and headaches.  Hematological: Does not bruise/bleed easily.  Psychiatric/Behavioral: Negative for confusion and suicidal ideas. The patient is not nervous/anxious.     Patient Active Problem List   Diagnosis Date Noted  . Encounter for screening colonoscopy   . Polyp of transverse colon   . Essential hypertension 08/11/2020  . Tobacco use 08/11/2020    Allergies  Allergen Reactions  . Amoxicillin Rash  . Ampicillin Rash    Past Surgical History:  Procedure Laterality Date  . COLONOSCOPY WITH PROPOFOL N/A 09/03/2020   Procedure: COLONOSCOPY WITH PROPOFOL;  Surgeon: Virgel Manifold, MD;  Location: [facility]  ENDOSCOPY;  Service: Endoscopy;  Laterality: N/A;    Social History   Tobacco Use  . Smoking status: Former Smoker    Packs/day: 1.00    Years: 47.00    Pack years: 47.00    Types: Cigarettes    Quit date: 06/27/2020    Years since quitting: 0.2  .  Smokeless tobacco: Never Used  Vaping Use  . Vaping Use: Never used  Substance Use Topics  . Alcohol use: Not Currently    Comment: quit at 5yrs  . Drug use: Not Currently     Medication list has been reviewed and updated.  Current Meds  Medication Sig  . aspirin EC 81 MG tablet Take 81 mg by mouth daily. Swallow whole.    PHQ 2/9 Scores 09/26/2020 07/28/2020 04/24/2020  PHQ - 2 Score 0 0 0  PHQ- 9 Score 0 7 0    GAD 7 : Generalized Anxiety Score 09/26/2020 07/28/2020 04/24/2020  Nervous, Anxious, on Edge 0 0 0  Control/stop worrying 0 0 0  Worry too much - different things 0 0 0  Trouble relaxing 0 0 0  Restless 0 0 0  Easily annoyed or irritable 0 0 0  Afraid - awful might happen 0 0 0  Total GAD 7 Score 0 0 0  Anxiety Difficulty - - Not difficult at all    BP Readings from Last 3 Encounters:  09/26/20 138/70  09/03/20 (!) 95/55  07/30/20 120/78    Physical Exam Vitals and nursing note reviewed.  HENT:     Head: Normocephalic.     Right Ear: Tympanic membrane, ear canal and external ear normal.     Left Ear: Tympanic membrane, ear canal and external ear normal.     Nose: Nose normal.  Eyes:     General: No scleral icterus.       Right eye: No discharge.        Left eye: No discharge.     Conjunctiva/sclera: Conjunctivae normal.     Pupils: Pupils are equal, round, and reactive to light.  Neck:     Thyroid: No thyromegaly.     Vascular: No JVD.     Trachea: No tracheal deviation.  Cardiovascular:     Rate and Rhythm: Normal rate and regular rhythm.     Heart sounds: Normal heart sounds. No murmur heard.  No friction rub. No gallop.   Pulmonary:     Effort: No respiratory distress.     Breath sounds: Normal breath sounds. No wheezing or rales.  Abdominal:     General: Bowel sounds are normal.     Palpations: Abdomen is soft. There is no mass.     Tenderness: There is no abdominal tenderness. There is no guarding or rebound.  Musculoskeletal:         General: No tenderness. Normal range of motion.     Cervical back: Normal range of motion and neck supple.  Lymphadenopathy:     Cervical: No cervical adenopathy.  Skin:    General: Skin is warm.     Findings: No rash.  Neurological:     Mental Status: He is alert and oriented to person, place, and time.     Cranial Nerves: No cranial nerve deficit.     Deep Tendon Reflexes: Reflexes are normal and symmetric.     Wt Readings from Last 3 Encounters:  09/26/20 272 lb (123.4 kg)  09/03/20 275 lb (124.7 kg)  08/05/20 259 lb (117.5 kg)  BP 138/70   Pulse 76   Ht 6\' 7"  (2.007 m)   Wt 272 lb (123.4 kg)   BMI 30.64 kg/m   Assessment and Plan: 1. Malaise and fatigue Previously noted on last visit.  Lab work was reviewed.  Patient has improved.  We will continue to watch and refer for his CKD.  2. Essential hypertension Chronic.  Controlled.  Stable.  Blood pressure is elevated today.  We will initiate lisinopril 5 mg once a day.  Review of Dr. Elwyn Lade note had suggestion of this and we will initiate prior to his next nephrology visit.- Renal Function Panel  3. Prediabetes Chronic.  Controlled.  Stable.  Patient is controlling only with diet at this time.  We will continue to watch with A1c's which we will draw today. - Hemoglobin A1c  4. Stage 3b chronic kidney disease ([place]) .  Controlled.  Stable.  Is uncertain as to the etiology of this and according to the note they were going to go into further evaluation to rule out autoimmune concerns.  We called Dr. Elwyn Lade office and got appointment for him and patient has the and will be seeing Dr. Holley Raring in about 30 days.  In the meantime we will initiate his lisinopril.  Patient has regained some weight but I think this is since we discontinued his chlorthalidone. - Renal Function Panel - Hemoglobin - Ambulatory referral to Nephrology  5. History of anemia Chronic.  Relatively controlled.  Stable.  This may be secondary to his CKD.   Patient does not have any hematemesis nor hematochezia.  We will check a hemoglobin. - Hemoglobin  6. Cigarette nicotine dependence without complication Patient has been advised of the health risks of smoking and counseled concerning cessation of tobacco products. I spent over 3 minutes for discussion and to answer questions.  Patient is down to maybe 2 to 3 cigarettes a week according to him I have emphasized that it reviewed.  Best to stop completely and he has in consideration.  I spent 30 minutes with this patient, More than 50% of that time was spent in face to face education, counseling and care coordination.

## 2020-09-27 LAB — RENAL FUNCTION PANEL
Albumin: 4.4 g/dL (ref 3.8–4.8)
BUN/Creatinine Ratio: 11 (ref 10–24)
BUN: 17 mg/dL (ref 8–27)
CO2: 22 mmol/L (ref 20–29)
Calcium: 9.7 mg/dL (ref 8.6–10.2)
Chloride: 99 mmol/L (ref 96–106)
Creatinine, Ser: 1.48 mg/dL — ABNORMAL HIGH (ref 0.76–1.27)
GFR calc Af Amer: 57 mL/min/{1.73_m2} — ABNORMAL LOW (ref >59)
GFR calc non Af Amer: 49 mL/min/{1.73_m2} — ABNORMAL LOW (ref >59)
Glucose: 114 mg/dL — ABNORMAL HIGH (ref 65–99)
Phosphorus: 2 mg/dL — ABNORMAL LOW (ref 2.8–4.1)
Potassium: 4.3 mmol/L (ref 3.5–5.2)
Sodium: 138 mmol/L (ref 134–144)

## 2020-09-27 LAB — HEMOGLOBIN: Hemoglobin: 11.8 g/dL — ABNORMAL LOW (ref 13.0–17.7)

## 2020-09-27 LAB — HEMOGLOBIN A1C
Est. average glucose Bld gHb Est-mCnc: 114 mg/dL
Hgb A1c MFr Bld: 5.6 % (ref 4.8–5.6)

## 2020-09-30 LAB — FERRITIN: Ferritin: 1655 ng/mL — ABNORMAL HIGH (ref 30–400)

## 2020-09-30 LAB — SPECIMEN STATUS REPORT

## 2020-12-08 ENCOUNTER — Ambulatory Visit: Payer: Medicare HMO | Admitting: Family Medicine

## 2021-03-16 ENCOUNTER — Telehealth: Payer: Self-pay | Admitting: Family Medicine

## 2021-03-16 NOTE — Telephone Encounter (Signed)
Copied from [place] [id number]. Topic: Medicare AWV >> Mar 16, 2021  2:18 PM Cher Nakai R wrote: Reason for CRM:  No answer unable to leave a  message for patient to call back and schedule Medicare Annual Wellness Visit (AWV) in office.   If unable to come into the office for AWV,  please offer to do virtually or by telephone.  No hx of AWV eligible as of 02/06/2021 awvi  Please schedule at anytime with [facility] Health Advisor.      40 Minutes appointment   Any questions, please call me at [phone]

## 2021-03-27 ENCOUNTER — Encounter: Payer: Self-pay | Admitting: Family Medicine

## 2021-03-27 ENCOUNTER — Other Ambulatory Visit: Payer: Self-pay

## 2021-03-27 ENCOUNTER — Ambulatory Visit (INDEPENDENT_AMBULATORY_CARE_PROVIDER_SITE_OTHER): Payer: Medicare Other | Admitting: Family Medicine

## 2021-03-27 VITALS — BP 170/100 | HR 120 | Ht 79.0 in | Wt 283.0 lb

## 2021-03-27 DIAGNOSIS — Z91199 Patient's noncompliance with other medical treatment and regimen due to unspecified reason: Secondary | ICD-10-CM

## 2021-03-27 DIAGNOSIS — Z9119 Patient's noncompliance with other medical treatment and regimen: Secondary | ICD-10-CM

## 2021-03-27 DIAGNOSIS — I10 Essential (primary) hypertension: Secondary | ICD-10-CM | POA: Diagnosis not present

## 2021-03-27 DIAGNOSIS — R7303 Prediabetes: Secondary | ICD-10-CM | POA: Diagnosis not present

## 2021-03-27 DIAGNOSIS — N1832 Chronic kidney disease, stage 3b: Secondary | ICD-10-CM | POA: Diagnosis not present

## 2021-03-27 MED ORDER — FERROUS SULFATE 325 (65 FE) MG PO TBEC
325.0000 mg | DELAYED_RELEASE_TABLET | Freq: Every day | ORAL | 3 refills | Status: DC
Start: 2021-03-27 — End: 2022-03-23

## 2021-03-27 MED ORDER — LISINOPRIL 5 MG PO TABS
5.0000 mg | ORAL_TABLET | Freq: Every day | ORAL | 1 refills | Status: DC
Start: 2021-03-27 — End: 2021-07-07

## 2021-03-27 NOTE — Progress Notes (Signed)
Date:  03/27/2021   Name:  Austin Dudley   DOB:  1956/08/14   MRN:  [id number]   Chief Complaint: Hypertension and Anemia  Hypertension This is a chronic problem. The current episode started more than 1 year ago. The problem has been gradually improving since onset. The problem is controlled. Pertinent negatives include no anxiety, blurred vision, chest pain, headaches, malaise/fatigue, neck pain, orthopnea, palpitations, peripheral edema, PND, shortness of breath or sweats. There are no associated agents to hypertension. There are no known risk factors for coronary artery disease. Past treatments include ACE inhibitors. The current treatment provides mild improvement. There are no compliance problems.  There is no history of angina, kidney disease, CAD/MI, CVA, heart failure, left ventricular hypertrophy or PVD. There is no history of chronic renal disease, a hypertension causing med or renovascular disease.  Anemia Presents for follow-up visit. There has been no abdominal pain, bruising/bleeding easily, confusion, fever, malaise/fatigue or palpitations. There is no history of chronic renal disease or heart failure.    Lab Results  Component Value Date   CREATININE 1.48 (H) 09/26/2020   BUN 17 09/26/2020   NA 138 09/26/2020   K 4.3 09/26/2020   CL 99 09/26/2020   CO2 22 09/26/2020   Lab Results  Component Value Date   CHOL 178 06/05/2020   HDL 30 (L) 06/05/2020   LDLCALC 123 (H) 06/05/2020   TRIG 138 06/05/2020   Lab Results  Component Value Date   TSH 1.820 07/28/2020   Lab Results  Component Value Date   HGBA1C 5.6 09/26/2020   Lab Results  Component Value Date   WBC 8.9 07/28/2020   HGB 11.8 (L) 09/26/2020   HCT 35.5 (L) 07/28/2020   MCV 85 07/28/2020   PLT 302 07/28/2020   Lab Results  Component Value Date   ALT 25 07/28/2020   AST 30 07/28/2020   ALKPHOS 79 07/28/2020   BILITOT 1.1 07/28/2020     Review of Systems  Constitutional: Negative for chills,  fever and malaise/fatigue.  HENT: Negative for drooling, ear discharge, ear pain and sore throat.   Eyes: Negative for blurred vision.  Respiratory: Negative for cough, shortness of breath and wheezing.   Cardiovascular: Negative for chest pain, palpitations, orthopnea, leg swelling and PND.  Gastrointestinal: Negative for abdominal pain, blood in stool, constipation, diarrhea and nausea.  Endocrine: Negative for polydipsia.  Genitourinary: Negative for dysuria, frequency, hematuria and urgency.  Musculoskeletal: Negative for back pain, myalgias and neck pain.  Skin: Negative for rash.  Allergic/Immunologic: Negative for environmental allergies.  Neurological: Negative for dizziness and headaches.  Hematological: Does not bruise/bleed easily.  Psychiatric/Behavioral: Negative for confusion and suicidal ideas. The patient is not nervous/anxious.     Patient Active Problem List   Diagnosis Date Noted  . Encounter for screening colonoscopy   . Polyp of transverse colon   . Essential hypertension 08/11/2020  . Tobacco use 08/11/2020    Allergies  Allergen Reactions  . Amoxicillin Rash  . Ampicillin Rash    Past Surgical History:  Procedure Laterality Date  . COLONOSCOPY WITH PROPOFOL N/A 09/03/2020   Procedure: COLONOSCOPY WITH PROPOFOL;  Surgeon: Virgel Manifold, MD;  Location: [facility] ENDOSCOPY;  Service: Endoscopy;  Laterality: N/A;    Social History   Tobacco Use  . Smoking status: Former Smoker    Packs/day: 1.00    Years: 47.00    Pack years: 47.00    Types: Cigarettes    Quit date: 06/27/2020  Years since quitting: 0.7  . Smokeless tobacco: Never Used  Vaping Use  . Vaping Use: Never used  Substance Use Topics  . Alcohol use: Not Currently    Comment: quit at 68yrs  . Drug use: Not Currently     Medication list has been reviewed and updated.  Current Meds  Medication Sig  . aspirin EC 81 MG tablet Take 81 mg by mouth daily. Swallow whole.  . ferrous  sulfate 325 (65 FE) MG EC tablet Take 325 mg by mouth daily with breakfast.  . lisinopril (ZESTRIL) 5 MG tablet Take 1 tablet (5 mg total) by mouth daily.    PHQ 2/9 Scores 03/27/2021 09/26/2020 07/28/2020 04/24/2020  PHQ - 2 Score 0 0 0 0  PHQ- 9 Score 0 0 7 0    GAD 7 : Generalized Anxiety Score 03/27/2021 09/26/2020 07/28/2020 04/24/2020  Nervous, Anxious, on Edge 0 0 0 0  Control/stop worrying 0 0 0 0  Worry too much - different things 0 0 0 0  Trouble relaxing 0 0 0 0  Restless 0 0 0 0  Easily annoyed or irritable 0 0 0 0  Afraid - awful might happen 0 0 0 0  Total GAD 7 Score 0 0 0 0  Anxiety Difficulty - - - Not difficult at all    BP Readings from Last 3 Encounters:  03/27/21 (!) 170/100  09/26/20 138/70  09/03/20 (!) 95/55    Physical Exam Vitals and nursing note reviewed.  HENT:     Head: Normocephalic.     Right Ear: Tympanic membrane, ear canal and external ear normal.     Left Ear: Tympanic membrane, ear canal and external ear normal.     Nose: Nose normal.     Mouth/Throat:     Mouth: Mucous membranes are moist.  Eyes:     General: No scleral icterus.       Right eye: No discharge.        Left eye: No discharge.     Conjunctiva/sclera: Conjunctivae normal.     Pupils: Pupils are equal, round, and reactive to light.  Neck:     Thyroid: No thyromegaly.     Vascular: No JVD.     Trachea: No tracheal deviation.  Cardiovascular:     Rate and Rhythm: Normal rate and regular rhythm.     Heart sounds: Normal heart sounds. No murmur heard. No friction rub. No gallop.   Pulmonary:     Effort: No respiratory distress.     Breath sounds: Normal breath sounds. No wheezing, rhonchi or rales.  Abdominal:     General: Bowel sounds are normal.     Palpations: Abdomen is soft. There is no mass.     Tenderness: There is no abdominal tenderness. There is no guarding or rebound.  Musculoskeletal:        General: No tenderness. Normal range of motion.     Cervical back:  Normal range of motion and neck supple.  Lymphadenopathy:     Cervical: No cervical adenopathy.  Skin:    General: Skin is warm.     Findings: No rash.  Neurological:     Mental Status: He is alert and oriented to person, place, and time.     Cranial Nerves: No cranial nerve deficit.     Deep Tendon Reflexes: Reflexes are normal and symmetric.     Wt Readings from Last 3 Encounters:  03/27/21 283 lb (128.4 kg)  09/26/20 272 lb (  123.4 kg)  09/03/20 275 lb (124.7 kg)    BP (!) 170/100   Pulse (!) 120   Ht 6\' 7"  (2.007 m)   Wt 283 lb (128.4 kg)   BMI 31.88 kg/m   Assessment and Plan: 1. Essential hypertension Chronic.  Uncontrolled.  Apparently patient did not understand that he needed to stay on the lisinopril and rise today on no medications.  Blood pressure was noted to be 170/100.  We will check renal function panel and CBC to see what current status is - Renal Function Panel - CBC w/Diff/Platelet  2. Prediabetes Chronic.  Controlled.  Stable.  Patient is also followed by Dr. Holley Raring.  We will check A1c for current A1c level.  3. Noncompliance Either through misunderstanding or medication running out has not been on medication apparently for couple months.  Therefore his blood pressure is elevated today.  We explained the importance of being on the lisinopril but is not a matter of resuming his chlorthalidone that I think contributed to some prerenal azotemia.  Blood pressure readings at the last nephrology while on lisinopril was in an excellent range.  Lisinopril has been resumed 5 mg once a day and patient is returning in 6 weeks for [place] to recheck his blood pressure on medication  4. Stage 3b chronic kidney disease ([place]) Followed by Dr. Holley Raring.  We will check renal function panel at next visit on medication.

## 2021-05-08 ENCOUNTER — Ambulatory Visit: Payer: Medicare Other | Admitting: Family Medicine

## 2021-06-13 IMAGING — CT CT CHEST LUNG CANCER SCREENING LOW DOSE W/O CM
2 of 5 series · 15 of 40 positions shown, 18 images · non-contrast
Comparison: None.

CLINICAL DATA: 64-year-old asymptomatic male former smoker with 47
pack-year smoking history, quit within the past month.

EXAM:
CT CHEST WITHOUT CONTRAST LOW-DOSE FOR LUNG CANCER SCREENING
TECHNIQUE: Multidetector CT imaging of the chest was performed following the
standard protocol without IV contrast.

[Series 3: lung 1.00 · axial · 0.69mm/px · z∈[-1193,-897]mm · 12 of 326 slices shown, 15 images]
[im 15/326  mediastinal]
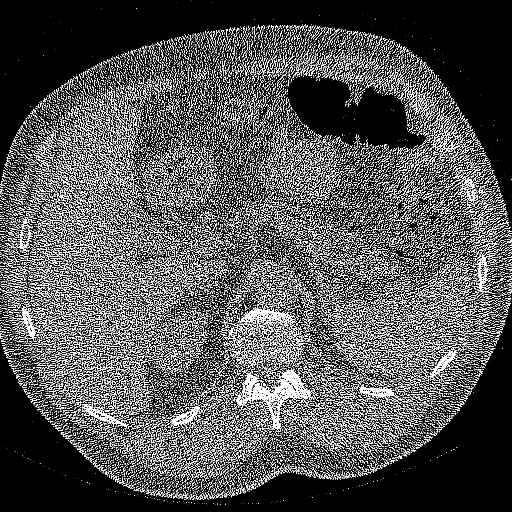
[im 15/326  lung]
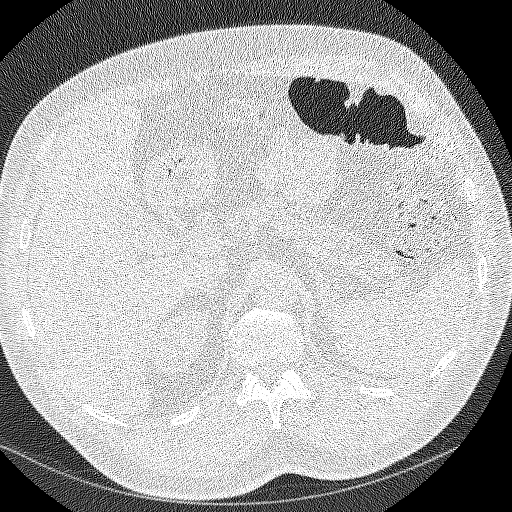
[im 45/326  lung]
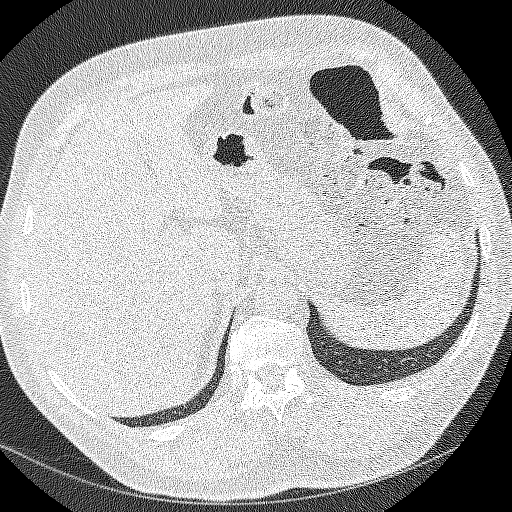
[im 74/326  lung]
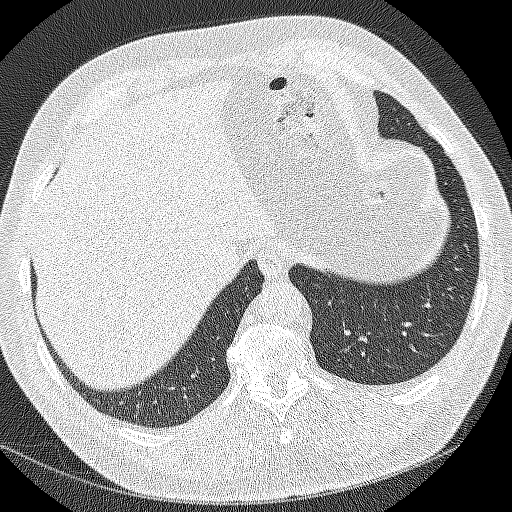
[im 104/326  lung]
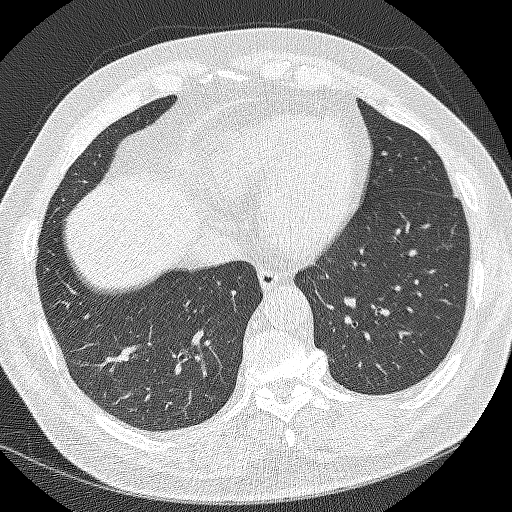
[im 119/326  mediastinal]
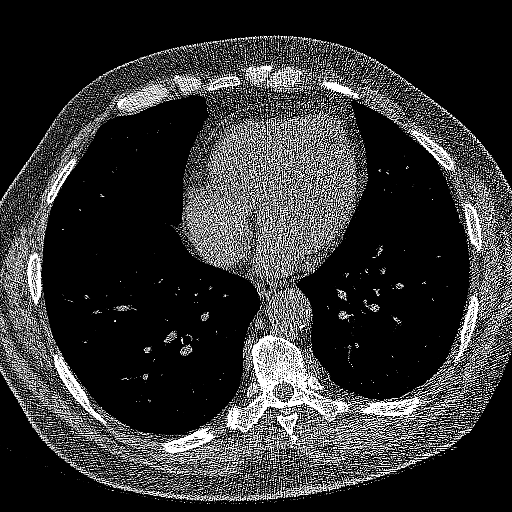
[im 119/326  lung]
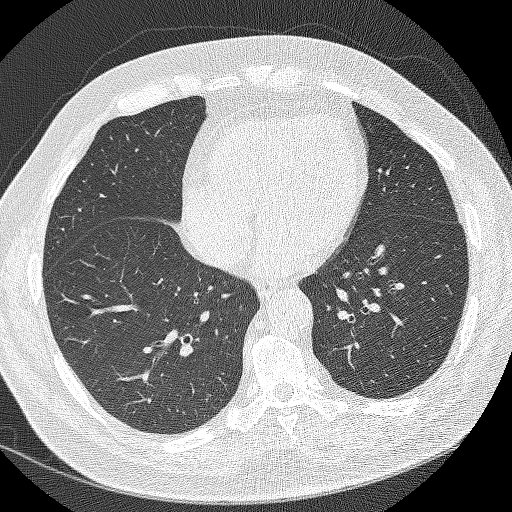
[im 148/326  lung]
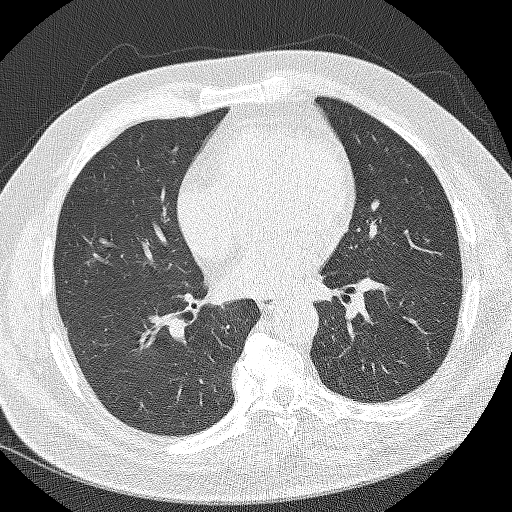
[im 178/326  lung]
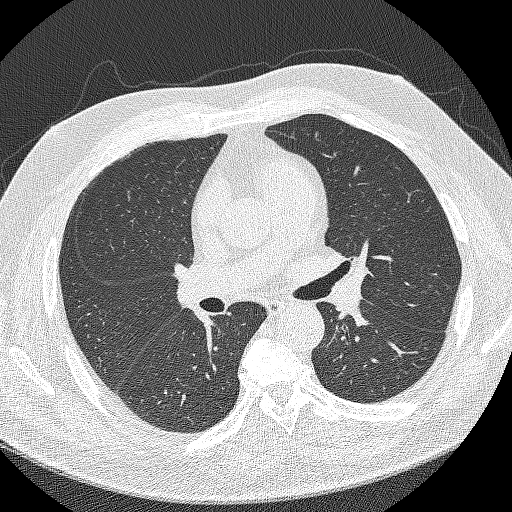
[im 207/326  lung]
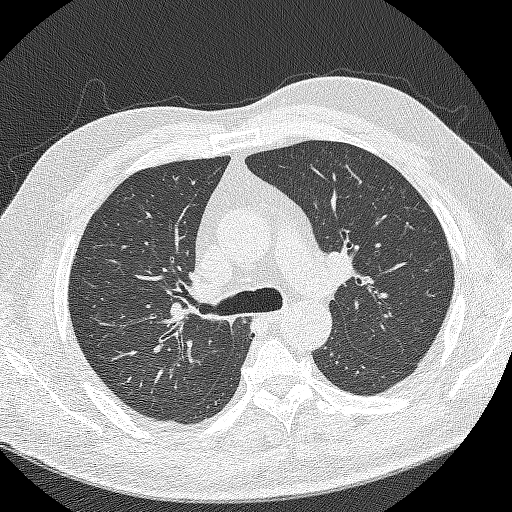
[im 222/326  mediastinal]
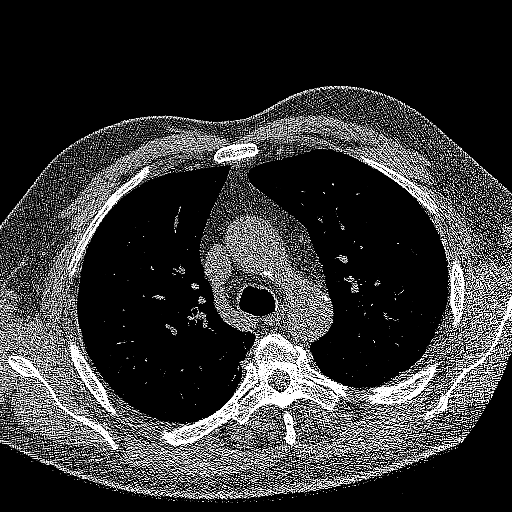
[im 222/326  lung]
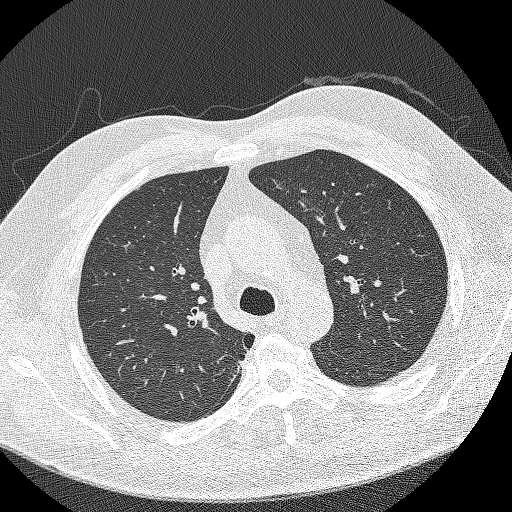
[im 252/326  lung]
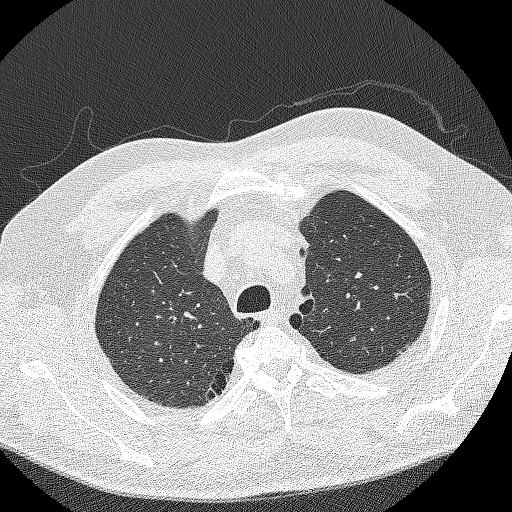
[im 281/326  lung]
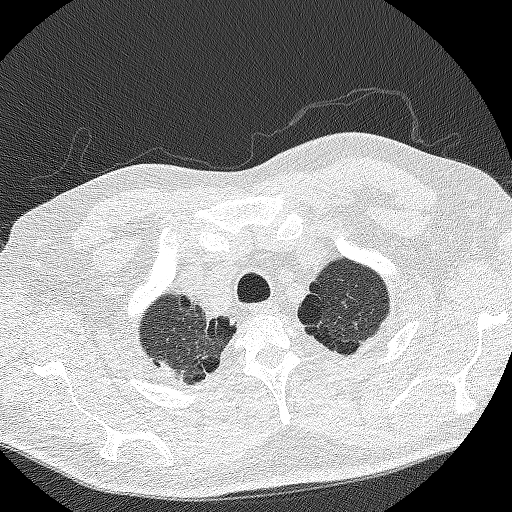
[im 311/326  lung]
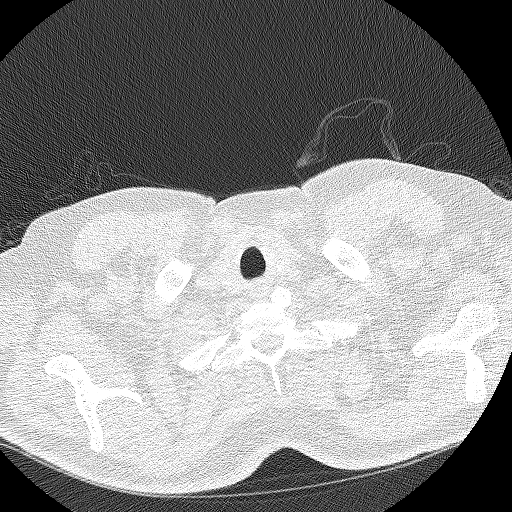

[Series 4: coronals lung 1.00 cor · coronal · 0.64mm/px · 3 of 336 slices shown]
[im 68/336  lung]
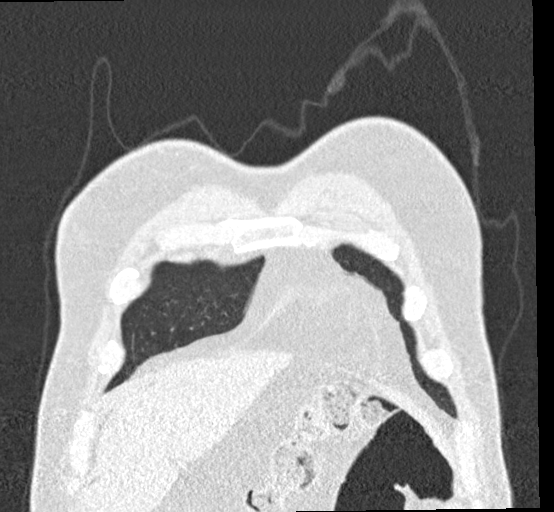
[im 135/336  lung]
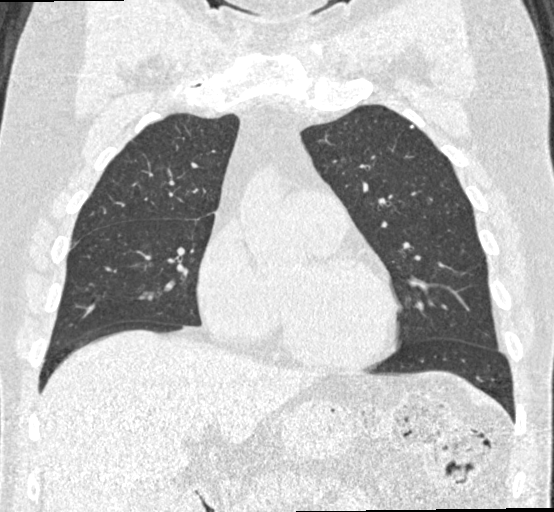
[im 202/336  lung]
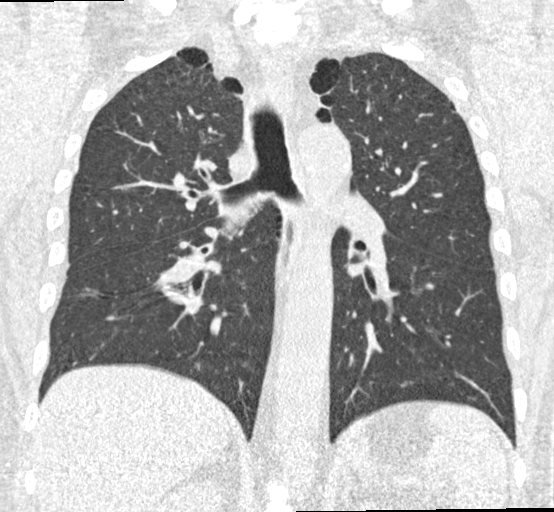

[15 of 40 positions shown; findings below may reference images not displayed]

FINDINGS: Cardiovascular: Normal heart size. No significant pericardial
effusion/thickening. Atherosclerotic nonaneurysmal thoracic aorta.
Normal caliber pulmonary arteries.

Mediastinum/Nodes: No discrete thyroid nodules. Unremarkable
esophagus. No pathologically enlarged axillary, mediastinal or hilar
lymph nodes, noting limited sensitivity for the detection of hilar
adenopathy on this noncontrast study.

Lungs/Pleura: No pneumothorax. No pleural effusion. Mild
centrilobular and paraseptal emphysema with mild diffuse bronchial
wall thickening. No acute consolidative airspace disease or lung
masses. A few scattered small solid pulmonary nodules, largest
mm in volume derived mean diameter in the posterior left upper lobe
(series 3/image 67).

Upper abdomen: No acute abnormality.

Musculoskeletal: No aggressive appearing focal osseous lesions. Mild
thoracic spondylosis.
IMPRESSION: 1. Lung-RADS 2, benign appearance or behavior. Continue annual
screening with low-dose chest CT without contrast in 12 months.
2. Aortic Atherosclerosis (EIOGW-KZM.M) and Emphysema (EIOGW-MG4.Z).

## 2021-06-19 ENCOUNTER — Encounter: Payer: Medicare Other | Admitting: Family Medicine

## 2021-07-07 ENCOUNTER — Ambulatory Visit (INDEPENDENT_AMBULATORY_CARE_PROVIDER_SITE_OTHER): Payer: Medicare Other | Admitting: Family Medicine

## 2021-07-07 ENCOUNTER — Encounter: Payer: Self-pay | Admitting: Family Medicine

## 2021-07-07 ENCOUNTER — Other Ambulatory Visit
Admission: RE | Admit: 2021-07-07 | Discharge: 2021-07-07 | Disposition: A | Discharge status: Home / Self Care | Payer: Medicare Other | Attending: Family Medicine | Admitting: Family Medicine

## 2021-07-07 ENCOUNTER — Other Ambulatory Visit: Payer: Self-pay

## 2021-07-07 VITALS — BP 130/60 | HR 80 | Ht 79.0 in | Wt 267.0 lb

## 2021-07-07 DIAGNOSIS — M109 Gout, unspecified: Secondary | ICD-10-CM | POA: Diagnosis not present

## 2021-07-07 DIAGNOSIS — R7303 Prediabetes: Secondary | ICD-10-CM

## 2021-07-07 DIAGNOSIS — M25562 Pain in left knee: Secondary | ICD-10-CM | POA: Diagnosis not present

## 2021-07-07 DIAGNOSIS — F17210 Nicotine dependence, cigarettes, uncomplicated: Secondary | ICD-10-CM

## 2021-07-07 DIAGNOSIS — I10 Essential (primary) hypertension: Secondary | ICD-10-CM | POA: Insufficient documentation

## 2021-07-07 DIAGNOSIS — R634 Abnormal weight loss: Secondary | ICD-10-CM | POA: Insufficient documentation

## 2021-07-07 DIAGNOSIS — Z9119 Patient's noncompliance with other medical treatment and regimen: Secondary | ICD-10-CM

## 2021-07-07 DIAGNOSIS — Z862 Personal history of diseases of the blood and blood-forming organs and certain disorders involving the immune mechanism: Secondary | ICD-10-CM | POA: Diagnosis not present

## 2021-07-07 DIAGNOSIS — Z91199 Patient's noncompliance with other medical treatment and regimen due to unspecified reason: Secondary | ICD-10-CM

## 2021-07-07 DIAGNOSIS — D649 Anemia, unspecified: Secondary | ICD-10-CM | POA: Diagnosis not present

## 2021-07-07 DIAGNOSIS — N1832 Chronic kidney disease, stage 3b: Secondary | ICD-10-CM | POA: Diagnosis not present

## 2021-07-07 LAB — CBC WITH DIFFERENTIAL/PLATELET
Abs Immature Granulocytes: 0.03 10*3/uL (ref 0.00–0.07)
Basophils Absolute: 0 10*3/uL (ref 0.0–0.1)
Basophils Relative: 1 %
Eosinophils Absolute: 0.3 10*3/uL (ref 0.0–0.5)
Eosinophils Relative: 4 %
HCT: 37.8 % — ABNORMAL LOW (ref 39.0–52.0)
Hemoglobin: 12.5 g/dL — ABNORMAL LOW (ref 13.0–17.0)
Immature Granulocytes: 1 %
Lymphocytes Relative: 24 %
Lymphs Abs: 1.6 10*3/uL (ref 0.7–4.0)
MCH: 28.1 pg (ref 26.0–34.0)
MCHC: 33.1 g/dL (ref 30.0–36.0)
MCV: 84.9 fL (ref 80.0–100.0)
Monocytes Absolute: 0.5 10*3/uL (ref 0.1–1.0)
Monocytes Relative: 7 %
Neutro Abs: 4.1 10*3/uL (ref 1.7–7.7)
Neutrophils Relative %: 63 %
Platelets: 347 10*3/uL (ref 150–400)
RBC: 4.45 MIL/uL (ref 4.22–5.81)
RDW: 13.5 % (ref 11.5–15.5)
WBC: 6.5 10*3/uL (ref 4.0–10.5)
nRBC: 0 % (ref 0.0–0.2)

## 2021-07-07 LAB — RENAL FUNCTION PANEL
Albumin: 3.8 g/dL (ref 3.5–5.0)
Anion gap: 12 (ref 5–15)
BUN: 83 mg/dL — ABNORMAL HIGH (ref 8–23)
CO2: 24 mmol/L (ref 22–32)
Calcium: 9.6 mg/dL (ref 8.9–10.3)
Chloride: 94 mmol/L — ABNORMAL LOW (ref 98–111)
Creatinine, Ser: 2.62 mg/dL — ABNORMAL HIGH (ref 0.61–1.24)
GFR, Estimated: 26 mL/min — ABNORMAL LOW (ref >60)
Glucose, Bld: 127 mg/dL — ABNORMAL HIGH (ref 70–99)
Phosphorus: 3.1 mg/dL (ref 2.5–4.6)
Potassium: 3.8 mmol/L (ref 3.5–5.1)
Sodium: 130 mmol/L — ABNORMAL LOW (ref 135–145)

## 2021-07-07 LAB — URIC ACID: Uric Acid, Serum: 15.1 mg/dL — ABNORMAL HIGH (ref 3.7–8.6)

## 2021-07-07 MED ORDER — LISINOPRIL 10 MG PO TABS: 10.0000 mg | ORAL_TABLET | Freq: Every day | ORAL | 0 refills | Status: DC

## 2021-07-07 MED ORDER — INDOMETHACIN 50 MG PO CAPS: 50.0000 mg | ORAL_CAPSULE | Freq: Three times a day (TID) | ORAL | 0 refills | Status: DC

## 2021-07-07 MED ORDER — LISINOPRIL 5 MG PO TABS: 5.0000 mg | ORAL_TABLET | Freq: Every day | ORAL | 0 refills | Status: DC

## 2021-07-07 NOTE — Progress Notes (Signed)
Date:  07/07/2021   Name:  [name]   DOB:  09-Nov-1955   MRN:  [id number]   Chief Complaint: Annual Exam and Knee Pain  Austin Dudley is a 65 y.o. male who presents today for his Complete Annual Exam. He feels well. He reports exercising poorly. He reports he is sleeping fairly well, if not in pain.    Knee Pain  The incident occurred 5 to 7 days ago. There was no injury mechanism. The pain is present in the left knee. The pain is at a severity of 7/10. The pain is moderate. The pain has been Constant since onset. Associated symptoms include an inability to bear weight. Pertinent negatives include no loss of motion, loss of sensation, muscle weakness, numbness or tingling. The symptoms are aggravated by movement. He has tried acetaminophen for the symptoms. The treatment provided mild relief.   Lab Results  Component Value Date   CREATININE 1.48 (H) 09/26/2020   BUN 17 09/26/2020   NA 138 09/26/2020   K 4.3 09/26/2020   CL 99 09/26/2020   CO2 22 09/26/2020   Lab Results  Component Value Date   CHOL 178 06/05/2020   HDL 30 (L) 06/05/2020   LDLCALC 123 (H) 06/05/2020   TRIG 138 06/05/2020   Lab Results  Component Value Date   TSH 1.820 07/28/2020   Lab Results  Component Value Date   HGBA1C 5.6 09/26/2020   Lab Results  Component Value Date   WBC 8.9 07/28/2020   HGB 11.8 (L) 09/26/2020   HCT 35.5 (L) 07/28/2020   MCV 85 07/28/2020   PLT 302 07/28/2020   Lab Results  Component Value Date   ALT 25 07/28/2020   AST 30 07/28/2020   ALKPHOS 79 07/28/2020   BILITOT 1.1 07/28/2020     Review of Systems  Constitutional:  Negative for chills and fever.  HENT:  Negative for drooling, ear discharge, ear pain and sore throat.   Respiratory:  Negative for cough, shortness of breath and wheezing.   Cardiovascular:  Negative for chest pain, palpitations and leg swelling.  Gastrointestinal:  Negative for abdominal pain, blood in stool, constipation, diarrhea and  nausea.  Endocrine: Positive for polydipsia. Negative for polyuria.  Genitourinary:  Negative for dysuria, frequency, hematuria and urgency.  Musculoskeletal:  Positive for arthralgias. Negative for back pain, myalgias and neck pain.  Skin:  Negative for rash.  Allergic/Immunologic: Negative for environmental allergies.  Neurological:  Negative for dizziness, tingling, numbness and headaches.  Hematological:  Does not bruise/bleed easily.  Psychiatric/Behavioral:  Negative for suicidal ideas. The patient is not nervous/anxious.    Patient Active Problem List   Diagnosis Date Noted   Encounter for screening colonoscopy    Polyp of transverse colon    Essential hypertension 08/11/2020   Tobacco use 08/11/2020    Allergies  Allergen Reactions   Amoxicillin Rash   Ampicillin Rash    Past Surgical History:  Procedure Laterality Date   COLONOSCOPY WITH PROPOFOL N/A 09/03/2020   Procedure: COLONOSCOPY WITH PROPOFOL;  Surgeon: Virgel Manifold, MD;  Location: [facility] END[facility]COPY;  Service: Endoscopy;  Laterality: N/A;    Social History   Tobacco Use   Smoking status: Former    Packs/day: 1.00    Years: 47.00    Pack years: 47.00    Types: Cigarettes    Quit date: 06/27/2020    Years since quitting: 1.0   Smokeless tobacco: Never  Vaping Use   Vaping Use: Never  used  Substance Use Topics   Alcohol use: Not Currently    Comment: quit at 72yr   Drug use: Not Currently     Medication list has been reviewed and updated.  Current Meds  Medication Sig   aspirin EC 81 MG tablet Take 81 mg by mouth daily. Swallow whole.   ferrous sulfate 325 (65 FE) MG EC tablet Take 1 tablet (325 mg total) by mouth daily with breakfast.   lisinopril (ZESTRIL) 5 MG tablet Take 1 tablet (5 mg total) by mouth daily.    PHQ 2/9 Scores 07/07/2021 03/27/2021 09/26/2020 07/28/2020  PHQ - 2 Score 0 0 0 0  PHQ- 9 Score 0 0 0 7    GAD 7 : Generalized Anxiety Score 07/07/2021 03/27/2021 09/26/2020  07/28/2020  Nervous, Anxious, on Edge 0 0 0 0  Control/stop worrying 0 0 0 0  Worry too much - different things 0 0 0 0  Trouble relaxing 0 0 0 0  Restless 0 0 0 0  Easily annoyed or irritable 0 0 0 0  Afraid - awful might happen 0 0 0 0  Total GAD 7 Score 0 0 0 0  Anxiety Difficulty - - - -    BP Readings from Last 3 Encounters:  07/07/21 130/60  03/27/21 (!) 170/100  09/26/20 138/70    Physical Exam Vitals and nursing note reviewed.  HENT:     Head: Normocephalic.     Right Ear: Tympanic membrane and external ear normal.     Left Ear: Tympanic membrane and external ear normal.     Nose: Nose normal.  Eyes:     General: No scleral icterus.       Right eye: No discharge.        Left eye: No discharge.     Conjunctiva/sclera: Conjunctivae normal.     Pupils: Pupils are equal, round, and reactive to light.  Neck:     Thyroid: No thyromegaly.     Vascular: No JVD.     Trachea: No tracheal deviation.  Cardiovascular:     Rate and Rhythm: Normal rate and regular rhythm.     Heart sounds: Normal heart sounds, S1 normal and S2 normal. No murmur heard. No systolic murmur is present.  No diastolic murmur is present.    No friction rub. No gallop. No S3 or S4 sounds.  Pulmonary:     Effort: No respiratory distress.     Breath sounds: Normal breath sounds. No wheezing, rhonchi or rales.  Abdominal:     General: Bowel sounds are normal.     Palpations: Abdomen is soft. There is no mass.     Tenderness: There is no abdominal tenderness. There is no guarding or rebound.  Musculoskeletal:        General: Normal range of motion.     Cervical back: Normal range of motion and neck supple.     Left knee: Tenderness present over the medial joint line and lateral joint line.     Right lower leg: 1+ Pitting Edema present.     Left lower leg: 1+ Pitting Edema present.  Lymphadenopathy:     Cervical: No cervical adenopathy.  Skin:    General: Skin is warm.     Findings: No rash.   Neurological:     Mental Status: He is alert and oriented to person, place, and time.     Cranial Nerves: No cranial nerve deficit.     Deep Tendon Reflexes: Reflexes are  normal and symmetric.    Wt Readings from Last 3 Encounters:  07/07/21 283 lb (128.4 kg)  03/27/21 283 lb (128.4 kg)  09/26/20 272 lb (123.4 kg)    BP 130/60   Pulse 80   Ht '6\' 7"'$  (2.007 m)   Wt 283 lb (128.4 kg)   BMI 31.88 kg/m   Assessment and Plan: 1. Essential hypertension Chronic.  Controlled.  Stable.  Blood pressure 130/60.  Continue lisinopril 5 mg once will recheck blood pressure in 3 weeks.  Will check renal function panel lites - Renal Function Panel - lisinopril (ZESTRIL) 5 MG tablet; Take 1 tablet (5 mg total) by mouth daily.  Dispense: 90 tablet; Refill: 0  2. Stage 3b chronic kidney disease ([facility]) Chronic.  Controlled.  Stable.  Will check renal function panel for current status.  We will refer back to Dr. Lateef/nephrology which patient missed appointment in March of this year. - Renal Function Panel - Ambulatory referral to Nephrology  3. Prediabetes Chronic.  Controlled.  Stable.  Will evaluate with A1c and  renal panel - Renal Function Panel - Hemoglobin A1c  4. Acute pain of left knee Denies acute pain in the left knee exquisitely tender in both the lateral and medial joint lines.  This could be consistent with gout for exacerbation of osteoarthritis.  Will check uric acid and treat with indomethacin 50 mg 3 times a day with meals for 4 days as needed. - Uric acid - indomethacin (INDOCIN) 50 MG capsule; Take 1 capsule (50 mg total) by mouth 3 (three) times daily with meals.  Dispense: 12 capsule; Refill: 0  5. History of anemia 3 anemia we will check CBC - CBC with Differential/Platelet  6. Cigarette nicotine dependence without complication Patient has been advised of the health risks of smoking and counseled concerning cessation of tobacco products. I spent over 3 minutes for  discussion and to answer questions.   7. Noncompliance Concerned that patient missed his nephrology appointment and explained to him that is important given the fragile nature of his GFR.  Blood pressure does look good on the current dosing of lisinopril.

## 2021-07-08 LAB — HEMOGLOBIN A1C
Hgb A1c MFr Bld: 6.6 % — ABNORMAL HIGH (ref 4.8–5.6)
Mean Plasma Glucose: 143 mg/dL

## 2021-07-09 ENCOUNTER — Telehealth: Payer: Self-pay

## 2021-07-09 DIAGNOSIS — R809 Proteinuria, unspecified: Secondary | ICD-10-CM | POA: Diagnosis not present

## 2021-07-09 DIAGNOSIS — N1831 Chronic kidney disease, stage 3a: Secondary | ICD-10-CM | POA: Diagnosis not present

## 2021-07-09 DIAGNOSIS — N2581 Secondary hyperparathyroidism of renal origin: Secondary | ICD-10-CM | POA: Diagnosis not present

## 2021-07-09 DIAGNOSIS — D631 Anemia in chronic kidney disease: Secondary | ICD-10-CM | POA: Diagnosis not present

## 2021-07-09 DIAGNOSIS — E790 Hyperuricemia without signs of inflammatory arthritis and tophaceous disease: Secondary | ICD-10-CM | POA: Diagnosis not present

## 2021-07-09 NOTE — Telephone Encounter (Signed)
Called with reminder to go to Dr Holley Raring this morning- arrive at 1045

## 2021-07-25 ENCOUNTER — Ambulatory Visit (INDEPENDENT_AMBULATORY_CARE_PROVIDER_SITE_OTHER): Payer: Medicare Other | Admitting: *Deleted

## 2021-07-25 ENCOUNTER — Other Ambulatory Visit: Payer: Self-pay

## 2021-07-25 DIAGNOSIS — Z0000 Encounter for general adult medical examination without abnormal findings: Secondary | ICD-10-CM

## 2021-07-25 NOTE — Progress Notes (Addendum)
Subjective:   Bly Bae is a 65 y.o. male who presents for Medicare Annual/Subsequent preventive examination.  I connected with  Gerline Legacy on 07/25/21 by a Audio enabled telemedicine application and verified that I am speaking with the correct person using two identifiers.   I discussed the limitations of evaluation and management by telemedicine. The patient expressed understanding and agreed to proceed.   Location of Patient: Home Location of Staff: Office  People attending Visit: Gerline Legacy and Emilio Aspen, RMA      Objective:    Today's Vitals   07/25/21 0951  PainSc: 0-No pain   There is no height or weight on file to calculate BMI.  Advanced Directives 07/25/2021 09/03/2020  Does Patient Have a Medical Advance Directive? No No  Would patient like information on creating a medical advance directive? No - Patient declined No - Patient declined    Current Medications (verified) Outpatient Encounter Medications as of 07/25/2021  Medication Sig   aspirin EC 81 MG tablet Take 81 mg by mouth daily. Swallow whole.   ferrous sulfate 325 (65 FE) MG EC tablet Take 1 tablet (325 mg total) by mouth daily with breakfast.   indomethacin (INDOCIN) 50 MG capsule Take 1 capsule (50 mg total) by mouth 3 (three) times daily with meals.   lisinopril (ZESTRIL) 5 MG tablet Take 1 tablet (5 mg total) by mouth daily.   No facility-administered encounter medications on file as of 07/25/2021.    Allergies (verified) Amoxicillin and Ampicillin   History: Past Medical History:  Diagnosis Date   Chronic kidney disease    Vertigo    Past Surgical History:  Procedure Laterality Date   COLONOSCOPY WITH PROPOFOL N/A 09/03/2020   Procedure: COLONOSCOPY WITH PROPOFOL;  Surgeon: Virgel Manifold, MD;  Location: [facility] ENDOSCOPY;  Service: Endoscopy;  Laterality: N/A;   Family History  Problem Relation Age of Onset   Stroke Father    Diabetes Sister    Social History    Socioeconomic History   Marital status: Single    Spouse name: N/A   Number of children: 1   Years of education: Eleven   Highest education level: 11th grade  Occupational History   Not on file  Tobacco Use   Smoking status: Some Days    Packs/day: 0.50    Years: 47.00    Pack years: 23.50    Types: Cigarettes    Last attempt to quit: 06/27/2020    Years since quitting: 1.0   Smokeless tobacco: Never   Tobacco comments:    Per pt he's coming down slowly  Vaping Use   Vaping Use: Never used  Substance and Sexual Activity   Alcohol use: Not Currently    Comment: quit at 7yr   Drug use: Not Currently   Sexual activity: Not Currently  Other Topics Concern   Not on file  Social History Narrative   Not on file   Social Determinants of Health   Financial Resource Strain: Low Risk    Difficulty of Paying Living Expenses: Not hard at all  Food Insecurity: No Food Insecurity   Worried About RCharity fundraiserin the Last Year: Never true   Ran Out of Food in the Last Year: Never true  Transportation Needs: No Transportation Needs   Lack of Transportation (Medical): No   Lack of Transportation (Non-Medical): No  Physical Activity: Sufficiently Active   Days of Exercise per Week: 3 days   Minutes  of Exercise per Session: 120 min  Stress: No Stress Concern Present   Feeling of Stress : Not at all  Social Connections: Socially Isolated   Frequency of Communication with Friends and Family: More than three times a week   Frequency of Social Gatherings with Friends and Family: Never   Attends Religious Services: Never   Marine scientist or Organizations: No   Attends Music therapist: Never   Marital Status: Never married    Tobacco Counseling Ready to quit: Yes Counseling given: Yes Tobacco comments: Per pt he's coming down slowly   Clinical Intake:  Pain : 0-10 Pain Score: 0-No pain     Diabetes: No CBG done?: No Did pt. bring in CBG  monitor from home?: No  How often do you need to have someone help you when you read instructions, pamphlets, or other written materials from your doctor or pharmacy?: 1 - Never What is the last grade level you completed in school?: Eleven Grade  Diabetic? No   Activities of Daily Living In your present state of health, do you have any difficulty performing the following activities: 07/07/2021  Hearing? N  Vision? N  Difficulty concentrating or making decisions? N  Walking or climbing stairs? Y  Dressing or bathing? N  Doing errands, shopping? N  Some recent data might be hidden    Patient Care Team: Juline Patch, MD as PCP - General (Family Medicine)  Indicate any recent Medical Services you may have received from other than [facility] providers in the past year (date may be approximate).     Assessment:   This is a routine wellness examination for [place]ig.  Hearing/Vision screen Patient will call provider to get schedule for screening    Goals Addressed   None   Depression Screen PHQ 2/9 Scores 07/25/2021 07/07/2021 03/27/2021 09/26/2020 07/28/2020 04/24/2020  PHQ - 2 Score 0 0 0 0 0 0  PHQ- 9 Score 0 0 0 0 7 0    Fall Risk Fall Risk  07/25/2021 07/07/2021 09/26/2020 07/28/2020 06/05/2020  Falls in the past year? 1 0 0 1 0  Number falls in past yr: 0 0 - 0 -  Injury with Fall? 0 0 - 0 -  Risk for fall due to : No Fall Risks Impaired balance/gait - - -  Follow up Falls evaluation completed Falls evaluation completed Falls evaluation completed Falls evaluation completed Falls evaluation completed    [place]:  Any stairs in or around the home? No  If so, are there any without handrails? No  Home free of loose throw rugs in walkways, pet beds, electrical cords, etc? No  Adequate lighting in your home to reduce risk of falls? Yes   ASSISTIVE DEVICES UTILIZED TO PREVENT FALLS:  Life alert? No  Use of a cane, walker or w/c? Yes  Grab bars in  the bathroom? No  Shower chair or bench in shower? No  Elevated toilet seat or a handicapped toilet? No      Cognitive Function:   6CIT Screen 07/25/2021  What Year? 0 points  What month? 0 points  What time? 0 points  Count back from 20 0 points  Months in reverse 0 points  Repeat phrase 0 points  Total Score 0    Immunizations  TDAP status: Due, Education has been provided regarding the importance of this vaccine. Advised may receive this vaccine at local pharmacy or Health Dept. Aware to provide  a copy of the vaccination record if obtained from local pharmacy or Health Dept. Verbalized acceptance and understanding.  Flu Vaccine status: Due, Education has been provided regarding the importance of this vaccine. Advised may receive this vaccine at local pharmacy or Health Dept. Aware to provide a copy of the vaccination record if obtained from local pharmacy or Health Dept. Verbalized acceptance and understanding.  Pneumococcal vaccine status: Due, Education has been provided regarding the importance of this vaccine. Advised may receive this vaccine at local pharmacy or Health Dept. Aware to provide a copy of the vaccination record if obtained from local pharmacy or Health Dept. Verbalized acceptance and understanding.  Covid-19 vaccine status: Information provided on how to obtain vaccines.   Qualifies for Shingles Vaccine? Yes   Zostavax completed No   Shingrix Completed?: No.    Education has been provided regarding the importance of this vaccine. Patient has been advised to call insurance company to determine out of pocket expense if they have not yet received this vaccine. Advised may also receive vaccine at local pharmacy or Health Dept. Verbalized acceptance and understanding.  Screening Tests Health Maintenance  Topic Date Due   COVID-19 Vaccine (1) Never done   Hepatitis C Screening  Never done   TETANUS/TDAP  Never done   Zoster Vaccines- Shingrix (1 of 2) Never done    COLONOSCOPY (Pts 45-16yr Insurance coverage will need to be confirmed)  09/03/2021   HPV VACCINES  Aged Out   INFLUENZA VACCINE  Discontinued    Health Maintenance  Health Maintenance Due  Topic Date Due   COVID-19 Vaccine (1) Never done   Hepatitis C Screening  Never done   TETANUS/TDAP  Never done   Zoster Vaccines- Shingrix (1 of 2) Never done    Colorectal cancer screening: Type of screening: Colonoscopy. Completed 09/03/2020. Repeat every 1 years  Lung Cancer Screening: (Low Dose CT Chest recommended if Age 66-80years, 30 pack-year currently smoking OR have quit w/in 15years.) does qualify. Screening completed 08/05/2020   Additional Screening:  Hepatitis C Screening: does qualify; Completed Have not completed. Will contact PCP office.   Vision Screening: Recommended annual ophthalmology exams for early detection of glaucoma and other disorders of the eye. Is the patient up to date with their annual eye exam?  Yes  Last vision test was 2 years ago, and he have reading glasses from over the counter. Who is the provider or what is the name of the office in which the patient attends annual eye exams? None/ patient will reach out to PCP If pt is not established with a provider, would they like to be referred to a provider to establish care? No .   Dental Screening: Recommended annual dental exams for proper oral hygiene    Plan:     I have personally reviewed and noted the following in the patient's chart:   Medical and social history Use of alcohol, tobacco or illicit drugs  Current medications and supplements including opioid prescriptions. Patient is not currently taking opioid prescriptions. Functional ability and status Nutritional status Physical activity Advanced directives List of other physicians Hospitalizations, surgeries, and ER visits in previous 12 months Vitals Screenings to include cognitive, depression, and falls Referrals and appointments  In  addition, I have reviewed and discussed with patient certain preventive protocols, quality metrics, and best practice recommendations. A written personalized care plan for preventive services as well as general preventive health recommendations were provided to patient.     REmilio Aspen  [place], [place]   [id number]   Nurse Notes: None Face to Face, 45 min   Spoke with patient with no one else to assist him. Patient verbalized understanding during entire visit. Informed patient to please remind provided during his next visit about scheduling his screening and vaccines that are needed. Patient agreed and verbalized understanding. Patient stating that his last vision test was 2 years ago, his. Staff informed patient to please call PCP office to schedule to get his screenings/tests and vaccines he is due for. Patient agreed and verbalized.    I have reviewed the health advisor's note, was available for consultation, and agree with the documentation and plans.  Otilio Miu MD

## 2021-07-28 ENCOUNTER — Ambulatory Visit (INDEPENDENT_AMBULATORY_CARE_PROVIDER_SITE_OTHER): Payer: Medicare Other | Admitting: Family Medicine

## 2021-07-28 ENCOUNTER — Other Ambulatory Visit: Payer: Self-pay

## 2021-07-28 ENCOUNTER — Encounter: Payer: Self-pay | Admitting: Family Medicine

## 2021-07-28 VITALS — BP 120/68 | HR 84 | Ht 79.0 in | Wt 265.0 lb

## 2021-07-28 DIAGNOSIS — I10 Essential (primary) hypertension: Secondary | ICD-10-CM

## 2021-07-28 DIAGNOSIS — M109 Gout, unspecified: Secondary | ICD-10-CM | POA: Diagnosis not present

## 2021-07-28 DIAGNOSIS — R7303 Prediabetes: Secondary | ICD-10-CM

## 2021-07-28 DIAGNOSIS — Z23 Encounter for immunization: Secondary | ICD-10-CM | POA: Diagnosis not present

## 2021-07-28 MED ORDER — LISINOPRIL 5 MG PO TABS: 5.0000 mg | ORAL_TABLET | Freq: Every day | ORAL | 1 refills | Status: DC

## 2021-07-28 NOTE — Progress Notes (Signed)
Date:  07/28/2021   Name:  Austin Dudley   DOB:  1956/07/31   MRN:  [id number]   Chief Complaint: Knee Pain (Dr Holley Raring wanted pt on Indomethacin, but due to kidney function- put him on pred and febuxostat), Chronic Kidney Disease (Started on lisinopril- recheck bp), and Flu Vaccine  Knee Pain  There was no injury mechanism. The pain is moderate. The pain has been Improving since onset. Pertinent negatives include no inability to bear weight, loss of motion, loss of sensation, muscle weakness, numbness or tingling. The symptoms are aggravated by movement, palpation and weight bearing. The treatment provided significant relief.   Lab Results  Component Value Date   CREATININE 2.62 (H) 07/07/2021   BUN 83 (H) 07/07/2021   NA 130 (L) 07/07/2021   K 3.8 07/07/2021   CL 94 (L) 07/07/2021   CO2 24 07/07/2021   Lab Results  Component Value Date   CHOL 178 06/05/2020   HDL 30 (L) 06/05/2020   LDLCALC 123 (H) 06/05/2020   TRIG 138 06/05/2020   Lab Results  Component Value Date   TSH 1.820 07/28/2020   Lab Results  Component Value Date   HGBA1C 6.6 (H) 07/07/2021   Lab Results  Component Value Date   WBC 6.5 07/07/2021   HGB 12.5 (L) 07/07/2021   HCT 37.8 (L) 07/07/2021   MCV 84.9 07/07/2021   PLT 347 07/07/2021   Lab Results  Component Value Date   ALT 25 07/28/2020   AST 30 07/28/2020   ALKPHOS 79 07/28/2020   BILITOT 1.1 07/28/2020     Review of Systems  Constitutional:  Negative for chills and fever.  HENT:  Negative for drooling, ear discharge, ear pain and sore throat.   Respiratory:  Negative for cough, shortness of breath and wheezing.   Cardiovascular:  Negative for chest pain, palpitations and leg swelling.  Gastrointestinal:  Negative for abdominal pain, blood in stool, constipation, diarrhea and nausea.  Endocrine: Negative for polydipsia.  Genitourinary:  Negative for dysuria, frequency, hematuria and urgency.  Musculoskeletal:  Negative for back pain,  myalgias and neck pain.  Skin:  Negative for rash.  Allergic/Immunologic: Negative for environmental allergies.  Neurological:  Negative for dizziness, tingling, numbness and headaches.  Hematological:  Does not bruise/bleed easily.  Psychiatric/Behavioral:  Negative for suicidal ideas. The patient is not nervous/anxious.    Patient Active Problem List   Diagnosis Date Noted   Encounter for screening colonoscopy    Polyp of transverse colon    Essential hypertension 08/11/2020   Tobacco use 08/11/2020    Allergies  Allergen Reactions   Amoxicillin Rash   Ampicillin Rash    Past Surgical History:  Procedure Laterality Date   COLONOSCOPY WITH PROPOFOL N/A 09/03/2020   Procedure: COLONOSCOPY WITH PROPOFOL;  Surgeon: Virgel Manifold, MD;  Location: [facility] ENDOSCOPY;  Service: Endoscopy;  Laterality: N/A;    Social History   Tobacco Use   Smoking status: Some Days    Packs/day: 0.50    Years: 47.00    Pack years: 23.50    Types: Cigarettes    Last attempt to quit: 06/27/2020    Years since quitting: 1.0   Smokeless tobacco: Never   Tobacco comments:    Per pt he's coming down slowly  Vaping Use   Vaping Use: Never used  Substance Use Topics   Alcohol use: Not Currently    Comment: quit at 37yrs   Drug use: Not Currently  Medication list has been reviewed and updated.  Current Meds  Medication Sig   aspirin EC 81 MG tablet Take 81 mg by mouth daily. Swallow whole.   ferrous sulfate 325 (65 FE) MG EC tablet Take 1 tablet (325 mg total) by mouth daily with breakfast.   lisinopril (ZESTRIL) 5 MG tablet Take 1 tablet (5 mg total) by mouth daily.    PHQ 2/9 Scores 07/28/2021 07/25/2021 07/07/2021 03/27/2021  PHQ - 2 Score 0 0 0 0  PHQ- 9 Score 0 0 0 0    GAD 7 : Generalized Anxiety Score 07/28/2021 07/07/2021 03/27/2021 09/26/2020  Nervous, Anxious, on Edge 0 0 0 0  Control/stop worrying 0 0 0 0  Worry too much - different things 0 0 0 0  Trouble relaxing 0 0 0 0   Restless 0 0 0 0  Easily annoyed or irritable 0 0 0 0  Afraid - awful might happen 0 0 0 0  Total GAD 7 Score 0 0 0 0  Anxiety Difficulty - - - -    BP Readings from Last 3 Encounters:  07/28/21 120/68  07/07/21 130/60  03/27/21 (!) 170/100    Physical Exam  Wt Readings from Last 3 Encounters:  07/28/21 265 lb (120.2 kg)  07/07/21 267 lb (121.1 kg)  03/27/21 283 lb (128.4 kg)    BP 120/68   Pulse 84   Ht 6\' 7"  (2.007 m)   Wt 265 lb (120.2 kg)   BMI 29.85 kg/m   Assessment and Plan:  1. Essential hypertension Chronic.  Controlled.  Stable.  120/68 patient is doing well on current dosing of lisinopril 5 mg once a day for reducing risk of nephrotoxicity of his prediabetes and keeping blood pressure in lower range.  Patient is being followed by nephrology as well as will be incorporating rheumatology because of his elevated uric acid in most recent gout arthropathy. - lisinopril (ZESTRIL) 5 MG tablet; Take 1 tablet (5 mg total) by mouth daily.  Dispense: 90 tablet; Refill: 1  2. Prediabetes Chronic.  Controlled.  Stable.  We will recheck patient in 4 months.  3. Gout, arthropathy Chronic.  Controlled.  Stable.  Patient has doing well on prednisone and is about to complete course of therapy.  4. Need for immunization against influenza Discussed and administered. - Flu Vaccine QUAD High Dose(Fluad)

## 2021-08-03 ENCOUNTER — Telehealth: Payer: Self-pay

## 2021-08-03 NOTE — Telephone Encounter (Signed)
Copied from [place] [id number]. Topic: General - Other >> Aug 03, 2021  2:48 PM Leward Quan A wrote: Reason for CRM: [place] with [facility] called in to inform Dr Ronnald Ramp that patient had 3 bottles of his lisinopril (ZESTRIL) 5 MG tablet and 1 bottle lisinopril (ZESTRIL) 10 MG tablet that he had been taking daily say that when she checked his BP was 108/60 she informed him of the medication and he purchased a BP cuff to check his BP daily. Please be informed any questions please call Merrily Brittle at Ph# [phone]

## 2021-08-04 ENCOUNTER — Telehealth: Payer: Self-pay

## 2021-08-04 NOTE — Telephone Encounter (Signed)
I called Dr Elwyn Lade office as well as pt and left message on Mary's vm ([facility] nurse). The patient is taking Lisinopril 5mg , not 10mg . He knows he is suppose to take the 5mg  qday. Dr Elwyn Lade nurse will add that to their med list in his chart as well

## 2021-09-01 DIAGNOSIS — N1832 Chronic kidney disease, stage 3b: Secondary | ICD-10-CM | POA: Diagnosis not present

## 2021-09-01 DIAGNOSIS — M1A00X0 Idiopathic chronic gout, unspecified site, without tophus (tophi): Secondary | ICD-10-CM | POA: Diagnosis not present

## 2021-09-22 DIAGNOSIS — N1832 Chronic kidney disease, stage 3b: Secondary | ICD-10-CM | POA: Diagnosis not present

## 2021-09-22 DIAGNOSIS — M1A00X0 Idiopathic chronic gout, unspecified site, without tophus (tophi): Secondary | ICD-10-CM | POA: Diagnosis not present

## 2021-09-23 ENCOUNTER — Other Ambulatory Visit: Payer: Self-pay

## 2021-09-23 ENCOUNTER — Encounter: Payer: Self-pay | Admitting: Family Medicine

## 2021-09-23 ENCOUNTER — Ambulatory Visit (INDEPENDENT_AMBULATORY_CARE_PROVIDER_SITE_OTHER): Payer: Medicare Other | Admitting: Family Medicine

## 2021-09-23 VITALS — BP 136/78 | HR 92 | Ht 79.0 in | Wt 281.0 lb

## 2021-09-23 DIAGNOSIS — E785 Hyperlipidemia, unspecified: Secondary | ICD-10-CM | POA: Diagnosis not present

## 2021-09-23 DIAGNOSIS — R7303 Prediabetes: Secondary | ICD-10-CM

## 2021-09-23 DIAGNOSIS — I10 Essential (primary) hypertension: Secondary | ICD-10-CM | POA: Diagnosis not present

## 2021-09-23 DIAGNOSIS — N1832 Chronic kidney disease, stage 3b: Secondary | ICD-10-CM | POA: Diagnosis not present

## 2021-09-23 MED ORDER — LISINOPRIL 5 MG PO TABS: 5.0000 mg | ORAL_TABLET | Freq: Every day | ORAL | 1 refills | Status: DC

## 2021-09-23 NOTE — Progress Notes (Signed)
Date:  09/23/2021   Name:  Austin Dudley   DOB:  1956/06/28   MRN:  [id number]   Chief Complaint: Prediabetes and Hypertension  Hypertension This is a chronic problem. The current episode started more than 1 year ago. The problem has been waxing and waning since onset. The problem is uncontrolled. Pertinent negatives include no anxiety, blurred vision, chest pain, headaches, neck pain, orthopnea, palpitations, PND or shortness of breath. There are no associated agents to hypertension. Risk factors for coronary artery disease include diabetes mellitus and dyslipidemia. Hypertensive end-organ damage includes CVA. There is no history of angina, kidney disease, CAD/MI, heart failure, left ventricular hypertrophy, PVD or retinopathy. There is no history of chronic renal disease, a hypertension causing med or renovascular disease.  Diabetes He presents for his follow-up diabetic visit. He has type 2 diabetes mellitus. His disease course has been stable. Pertinent negatives for hypoglycemia include no dizziness, headaches or nervousness/anxiousness. Pertinent negatives for diabetes include no blurred vision, no chest pain, no polydipsia, no polyphagia, no polyuria and no weight loss. Symptoms are stable. Diabetic complications include a CVA. Pertinent negatives for diabetic complications include no PVD or retinopathy. Current diabetic treatment includes diet. Meal planning includes avoidance of concentrated sweets and carbohydrate counting.  Hyperlipidemia This is a chronic problem. The current episode started more than 1 year ago. The problem is controlled. Recent lipid tests were reviewed and are normal. He has no history of chronic renal disease. Pertinent negatives include no chest pain, myalgias or shortness of breath. Current antihyperlipidemic treatment includes statins. The current treatment provides moderate improvement of lipids. There are no compliance problems.    Lab Results  Component Value  Date   CREATININE 2.62 (H) 07/07/2021   BUN 83 (H) 07/07/2021   NA 130 (L) 07/07/2021   K 3.8 07/07/2021   CL 94 (L) 07/07/2021   CO2 24 07/07/2021   Lab Results  Component Value Date   CHOL 178 06/05/2020   HDL 30 (L) 06/05/2020   LDLCALC 123 (H) 06/05/2020   TRIG 138 06/05/2020   Lab Results  Component Value Date   TSH 1.820 07/28/2020   Lab Results  Component Value Date   HGBA1C 6.6 (H) 07/07/2021   Lab Results  Component Value Date   WBC 6.5 07/07/2021   HGB 12.5 (L) 07/07/2021   HCT 37.8 (L) 07/07/2021   MCV 84.9 07/07/2021   PLT 347 07/07/2021   Lab Results  Component Value Date   ALT 25 07/28/2020   AST 30 07/28/2020   ALKPHOS 79 07/28/2020   BILITOT 1.1 07/28/2020     Review of Systems  Constitutional:  Negative for chills, fever and weight loss.  HENT:  Negative for drooling, ear discharge, ear pain and sore throat.   Eyes:  Negative for blurred vision.  Respiratory:  Negative for cough, shortness of breath and wheezing.   Cardiovascular:  Negative for chest pain, palpitations, orthopnea, leg swelling and PND.  Gastrointestinal:  Negative for abdominal pain, blood in stool, constipation, diarrhea and nausea.  Endocrine: Negative for polydipsia, polyphagia and polyuria.  Genitourinary:  Negative for dysuria, frequency, hematuria and urgency.  Musculoskeletal:  Negative for back pain, myalgias and neck pain.  Skin:  Negative for rash.  Allergic/Immunologic: Negative for environmental allergies.  Neurological:  Negative for dizziness and headaches.  Hematological:  Does not bruise/bleed easily.  Psychiatric/Behavioral:  Negative for suicidal ideas. The patient is not nervous/anxious.    Patient Active Problem List   Diagnosis  Date Noted   Encounter for screening colonoscopy    Polyp of transverse colon    Essential hypertension 08/11/2020   Tobacco use 08/11/2020    Allergies  Allergen Reactions   Amoxicillin Rash   Ampicillin Rash    Past  Surgical History:  Procedure Laterality Date   COLONOSCOPY WITH PROPOFOL N/A 09/03/2020   Procedure: COLONOSCOPY WITH PROPOFOL;  Surgeon: Virgel Manifold, MD;  Location: [facility] ENDOSCOPY;  Service: Endoscopy;  Laterality: N/A;    Social History   Tobacco Use   Smoking status: Some Days    Packs/day: 0.50    Years: 47.00    Pack years: 23.50    Types: Cigarettes    Last attempt to quit: 06/27/2020    Years since quitting: 1.2   Smokeless tobacco: Never   Tobacco comments:    Per pt he's coming down slowly  Vaping Use   Vaping Use: Never used  Substance Use Topics   Alcohol use: Not Currently    Comment: quit at 30yrs   Drug use: Not Currently     Medication list has been reviewed and updated.  No outpatient medications have been marked as taking for the 09/23/21 encounter (Office Visit) with Juline Patch, MD.    [facility] 2/9 Scores 07/28/2021 07/25/2021 07/07/2021 03/27/2021  PHQ - 2 Score 0 0 0 0  PHQ- 9 Score 0 0 0 0    GAD 7 : Generalized Anxiety Score 07/28/2021 07/07/2021 03/27/2021 09/26/2020  Nervous, Anxious, on Edge 0 0 0 0  Control/stop worrying 0 0 0 0  Worry too much - different things 0 0 0 0  Trouble relaxing 0 0 0 0  Restless 0 0 0 0  Easily annoyed or irritable 0 0 0 0  Afraid - awful might happen 0 0 0 0  Total GAD 7 Score 0 0 0 0  Anxiety Difficulty - - - -    BP Readings from Last 3 Encounters:  07/28/21 120/68  07/07/21 130/60  03/27/21 (!) 170/100    Physical Exam Vitals and nursing note reviewed.  HENT:     Head: Normocephalic.     Right Ear: Tympanic membrane, ear canal and external ear normal.     Left Ear: Tympanic membrane, ear canal and external ear normal.     Nose: Nose normal. No congestion or rhinorrhea.  Eyes:     General: No scleral icterus.       Right eye: No discharge.        Left eye: No discharge.     Conjunctiva/sclera: Conjunctivae normal.     Pupils: Pupils are equal, round, and reactive to light.  Neck:      Thyroid: No thyromegaly.     Vascular: No JVD.     Trachea: No tracheal deviation.  Cardiovascular:     Rate and Rhythm: Normal rate and regular rhythm.     Heart sounds: Normal heart sounds. No murmur heard.   No friction rub. No gallop.  Pulmonary:     Effort: No respiratory distress.     Breath sounds: Normal breath sounds. No wheezing, rhonchi or rales.  Abdominal:     General: Bowel sounds are normal.     Palpations: Abdomen is soft. There is no mass.     Tenderness: There is no abdominal tenderness. There is no guarding or rebound.  Musculoskeletal:        General: No tenderness. Normal range of motion.     Cervical back: Normal range  of motion and neck supple.  Lymphadenopathy:     Cervical: No cervical adenopathy.  Skin:    General: Skin is warm.     Findings: No rash.  Neurological:     Mental Status: He is alert and oriented to person, place, and time.     Cranial Nerves: No cranial nerve deficit.     Deep Tendon Reflexes: Reflexes are normal and symmetric.    Wt Readings from Last 3 Encounters:  07/28/21 265 lb (120.2 kg)  07/07/21 267 lb (121.1 kg)  03/27/21 283 lb (128.4 kg)    There were no vitals taken for this visit.  Assessment and Plan:  1. Essential hypertension Chronic.  Controlled.  Stable.  Blood pressure today is 136/78.  Continue lisinopril 5 mg once a day.  Will check renal function panel for GFR and electrolyte status. - Renal Function Panel - lisinopril (ZESTRIL) 5 MG tablet; Take 1 tablet (5 mg total) by mouth daily.  Dispense: 90 tablet; Refill: 1  2. Prediabetes Chronic.  Stable.  Previous A1c of 6.6.  We will continue to follow with A1c's and renal function panel. - Renal Function Panel - Hemoglobin A1c  3. Stage 3b chronic kidney disease (HCC) Chronic.  Controlled.  Stable.  Patient is followed by nephrology.  We will obtain renal function panel for current GFR. - Renal Function Panel  4. Hyperlipidemia, unspecified hyperlipidemia  type Chronic.  Controlled.  Stable.  Will check lipid panel with emphasis on LDL and triglyceride levels.  We will treat accordingly if elevated. - Lipid Panel With LDL/HDL Ratio

## 2021-09-24 LAB — RENAL FUNCTION PANEL
Albumin: 4.5 g/dL (ref 3.8–4.8)
BUN/Creatinine Ratio: 10 (ref 10–24)
BUN: 18 mg/dL (ref 8–27)
CO2: 24 mmol/L (ref 20–29)
Calcium: 9.7 mg/dL (ref 8.6–10.2)
Chloride: 100 mmol/L (ref 96–106)
Creatinine, Ser: 1.81 mg/dL — ABNORMAL HIGH (ref 0.76–1.27)
Glucose: 110 mg/dL — ABNORMAL HIGH (ref 70–99)
Phosphorus: 2.3 mg/dL — ABNORMAL LOW (ref 2.8–4.1)
Potassium: 4.4 mmol/L (ref 3.5–5.2)
Sodium: 139 mmol/L (ref 134–144)
eGFR: 41 mL/min/{1.73_m2} — ABNORMAL LOW (ref >59)

## 2021-09-24 LAB — LIPID PANEL WITH LDL/HDL RATIO
Cholesterol, Total: 175 mg/dL (ref 100–199)
HDL: 33 mg/dL — ABNORMAL LOW (ref >39)
LDL Chol Calc (NIH): 106 mg/dL — ABNORMAL HIGH (ref 0–99)
LDL/HDL Ratio: 3.2 ratio (ref 0.0–3.6)
Triglycerides: 205 mg/dL — ABNORMAL HIGH (ref 0–149)
VLDL Cholesterol Cal: 36 mg/dL (ref 5–40)

## 2021-09-24 LAB — HEMOGLOBIN A1C
Est. average glucose Bld gHb Est-mCnc: 114 mg/dL
Hgb A1c MFr Bld: 5.6 % (ref 4.8–5.6)

## 2021-10-08 DIAGNOSIS — L989 Disorder of the skin and subcutaneous tissue, unspecified: Secondary | ICD-10-CM | POA: Diagnosis not present

## 2021-10-15 DIAGNOSIS — D235 Other benign neoplasm of skin of trunk: Secondary | ICD-10-CM | POA: Diagnosis not present

## 2021-11-27 ENCOUNTER — Ambulatory Visit: Payer: Medicare Other | Admitting: Family Medicine

## 2021-12-21 ENCOUNTER — Telehealth: Payer: Self-pay | Admitting: Acute Care

## 2021-12-21 NOTE — Telephone Encounter (Signed)
Left message for pt to call back to schedule f/u lung screening CT scan.  ?

## 2022-03-23 ENCOUNTER — Encounter: Payer: Self-pay | Admitting: Family Medicine

## 2022-03-23 ENCOUNTER — Ambulatory Visit (INDEPENDENT_AMBULATORY_CARE_PROVIDER_SITE_OTHER): Payer: Medicare HMO | Admitting: Family Medicine

## 2022-03-23 VITALS — BP 148/86 | HR 80 | Ht 79.0 in | Wt 290.0 lb

## 2022-03-23 DIAGNOSIS — Z23 Encounter for immunization: Secondary | ICD-10-CM

## 2022-03-23 DIAGNOSIS — I10 Essential (primary) hypertension: Secondary | ICD-10-CM

## 2022-03-23 DIAGNOSIS — R7303 Prediabetes: Secondary | ICD-10-CM | POA: Diagnosis not present

## 2022-03-23 DIAGNOSIS — Z862 Personal history of diseases of the blood and blood-forming organs and certain disorders involving the immune mechanism: Secondary | ICD-10-CM

## 2022-03-23 DIAGNOSIS — E785 Hyperlipidemia, unspecified: Secondary | ICD-10-CM | POA: Diagnosis not present

## 2022-03-23 MED ORDER — LISINOPRIL-HYDROCHLOROTHIAZIDE 10-12.5 MG PO TABS: 1.0000 | ORAL_TABLET | Freq: Every day | ORAL | 0 refills | Status: DC

## 2022-03-23 MED ORDER — FERROUS SULFATE 325 (65 FE) MG PO TBEC: 325.0000 mg | DELAYED_RELEASE_TABLET | Freq: Every day | ORAL | 3 refills | Status: DC

## 2022-03-23 NOTE — Progress Notes (Signed)
e

## 2022-03-23 NOTE — Progress Notes (Signed)
? ? ?Date:  03/23/2022  ? ?Name:  Austin Dudley   DOB:  [birth year]   MRN:  [id number] ? ? ?Chief Complaint: Hypertension, Anemia, Prediabetes, and pneumonia vacc ? ?Hypertension ?This is a chronic problem. The current episode started more than 1 year ago. The problem has been waxing and waning since onset. The problem is uncontrolled. Pertinent negatives include no malaise/fatigue. There are no associated agents to hypertension. Risk factors for coronary artery disease include dyslipidemia. Past treatments include ACE inhibitors. The current treatment provides mild improvement. There are no compliance problems.  There is no history of angina, kidney disease, CAD/MI, CVA, heart failure, left ventricular hypertrophy, PVD or retinopathy. There is no history of chronic renal disease, a hypertension causing med or renovascular disease.  ?Anemia ?Presents for follow-up visit. There has been no abdominal pain or malaise/fatigue. There is no history of chronic renal disease or heart failure.  ?Diabetes ?He has type 2 (for prediabetes) diabetes mellitus. There are no hypoglycemic associated symptoms. There are no diabetic associated symptoms. There are no hypoglycemic complications. Symptoms are stable. Pertinent negatives for diabetic complications include no CVA, PVD or retinopathy. Risk factors for coronary artery disease include diabetes mellitus.  ? ?Lab Results  ?Component Value Date  ? NA 139 09/23/2021  ? K 4.4 09/23/2021  ? CO2 24 09/23/2021  ? GLUCOSE 110 (H) 09/23/2021  ? BUN 18 09/23/2021  ? CREATININE 1.81 (H) 09/23/2021  ? CALCIUM 9.7 09/23/2021  ? EGFR 41 (L) 09/23/2021  ? GFRNONAA 26 (L) 07/07/2021  ? ?Lab Results  ?Component Value Date  ? CHOL 175 09/23/2021  ? HDL 33 (L) 09/23/2021  ? [place] 106 (H) 09/23/2021  ? TRIG 205 (H) 09/23/2021  ? ?Lab Results  ?Component Value Date  ? TSH 1.820 07/28/2020  ? ?Lab Results  ?Component Value Date  ? HGBA1C 5.6 09/23/2021  ? ?Lab Results  ?Component Value Date  ? WBC  6.5 07/07/2021  ? HGB 12.5 (L) 07/07/2021  ? HCT 37.8 (L) 07/07/2021  ? MCV 84.9 07/07/2021  ? PLT 347 07/07/2021  ? ?Lab Results  ?Component Value Date  ? ALT 25 07/28/2020  ? AST 30 07/28/2020  ? ALKPHOS 79 07/28/2020  ? BILITOT 1.1 07/28/2020  ? ?No results found for: 25OHVITD2, [place], VD25OH  ? ?Review of Systems  ?Constitutional:  Negative for malaise/fatigue.  ?Gastrointestinal:  Negative for abdominal pain.  ? ?Patient Active Problem List  ? Diagnosis Date Noted  ? Encounter for screening colonoscopy   ? Polyp of transverse colon   ? Essential hypertension 08/11/2020  ? Tobacco use 08/11/2020  ? ? ?Allergies  ?Allergen Reactions  ? Amoxicillin Rash  ? Ampicillin Rash  ? ? ?Past Surgical History:  ?Procedure Laterality Date  ? COLONOSCOPY WITH PROPOFOL N/A 09/03/2020  ? Procedure: COLONOSCOPY WITH PROPOFOL;  Surgeon: Virgel Manifold, MD;  Location: [facility] ENDOSCOPY;  Service: Endoscopy;  Laterality: N/A;  ? ? ?Social History  ? ?Tobacco Use  ? Smoking status: Some Days  ?  Packs/day: 0.50  ?  Years: 47.00  ?  Pack years: 23.50  ?  Types: Cigarettes  ?  Last attempt to quit: 06/27/2020  ?  Years since quitting: 1.7  ? Smokeless tobacco: Never  ? Tobacco comments:  ?  Per pt he's coming down slowly  ?Vaping Use  ? Vaping Use: Never used  ?Substance Use Topics  ? Alcohol use: Not Currently  ?  Comment: quit at 76yr  ? Drug use: Not Currently  ? ? ? ?  Medication list has been reviewed and updated. ? ?Current Meds  ?Medication Sig  ? aspirin EC 81 MG tablet Take 81 mg by mouth daily. Swallow whole.  ? lisinopril (ZESTRIL) 5 MG tablet Take 1 tablet (5 mg total) by mouth daily.  ? ? ? ?  03/23/2022  ?  9:15 AM 09/23/2021  ?  9:15 AM 07/28/2021  ? 10:14 AM 07/07/2021  ?  9:57 AM  ?GAD 7 : Generalized Anxiety Score  ?Nervous, Anxious, on Edge 0 0 0 0  ?Control/stop worrying 0 0 0 0  ?Worry too much - different things 0 0 0 0  ?Trouble relaxing 0 0 0 0  ?Restless 0 0 0 0  ?Easily annoyed or irritable 0 0 0 0  ?Afraid  - awful might happen 0 0 0 0  ?Total GAD 7 Score 0 0 0 0  ?Anxiety Difficulty Not difficult at all Not difficult at all    ? ? ? ?  03/23/2022  ?  9:15 AM  ?Depression screen PHQ 2/9  ?Decreased Interest 0  ?Down, Depressed, Hopeless 0  ?PHQ - 2 Score 0  ?Altered sleeping 0  ?Tired, decreased energy 0  ?Change in appetite 0  ?Feeling bad or failure about yourself  0  ?Trouble concentrating 0  ?Moving slowly or fidgety/restless 0  ?Suicidal thoughts 0  ?PHQ-9 Score 0  ?Difficult doing work/chores Not difficult at all  ? ? ?BP Readings from Last 3 Encounters:  ?03/23/22 (!) 150/80  ?09/23/21 136/78  ?07/28/21 120/68  ? ? ?Physical Exam ?Vitals and nursing note reviewed.  ?HENT:  ?   Head: Normocephalic.  ?   Right Ear: Tympanic membrane and external ear normal.  ?   Left Ear: Tympanic membrane and external ear normal.  ?   Nose: Nose normal.  ?Eyes:  ?   General: No scleral icterus.    ?   Right eye: No discharge.     ?   Left eye: No discharge.  ?   Conjunctiva/sclera: Conjunctivae normal.  ?   Pupils: Pupils are equal, round, and reactive to light.  ?Neck:  ?   Thyroid: No thyromegaly.  ?   Vascular: No JVD.  ?   Trachea: No tracheal deviation.  ?Cardiovascular:  ?   Rate and Rhythm: Normal rate and regular rhythm.  ?   Heart sounds: Normal heart sounds. No murmur heard. ?  No friction rub. No gallop.  ?Pulmonary:  ?   Effort: No respiratory distress.  ?   Breath sounds: Normal breath sounds. No wheezing or rales.  ?Abdominal:  ?   General: Bowel sounds are normal.  ?   Palpations: Abdomen is soft. There is no mass.  ?   Tenderness: There is no abdominal tenderness. There is no guarding or rebound.  ?Musculoskeletal:     ?   General: No tenderness. Normal range of motion.  ?   Cervical back: Normal range of motion and neck supple.  ?Lymphadenopathy:  ?   Cervical: No cervical adenopathy.  ?Skin: ?   General: Skin is warm.  ?   Findings: No rash.  ?Neurological:  ?   Mental Status: He is alert.  ?   Deep Tendon  Reflexes: Reflexes are normal and symmetric.  ? ? ?Wt Readings from Last 3 Encounters:  ?03/23/22 290 lb (131.5 kg)  ?09/23/21 281 lb (127.5 kg)  ?07/28/21 265 lb (120.2 kg)  ? ? ?BP (!) 150/80   Pulse 80   Ht  '6\' 7"'  (2.007 m)   Wt 290 lb (131.5 kg)   BMI 32.67 kg/m?  ? ?Assessment and Plan: ? ?1. Essential hypertension ?Chronic.  Uncontrolled.  Stable.  Blood pressure 148/86.  Will increase to lisinopril hydrochlorothiazide 10-12 0.5 and recheck patient in 6 to 8 weeks. ?- Renal Function Panel ?- lisinopril-hydrochlorothiazide (ZESTORETIC) 10-12.5 MG tablet; Take 1 tablet by mouth daily.  Dispense: 90 tablet; Refill: 0 ? ?2. Prediabetes ?Chronic.  Controlled.  Stable.  Currently controlling with diet only at this time.  We will check A1c and renal function panel. ?- HgB A1c ?- Renal Function Panel ? ?3. Hyperlipidemia, unspecified hyperlipidemia type ?Chronic.  Controlled.  Stable and controlling with diet we will check lipid panel and patient has been given low-cholesterol guidelines. ?- Lipid Panel With LDL/HDL Ratio ? ?4. History of anemia ?Chronic.  Controlled.  Stable.  Patient does have some constipation but is taking his ferrous sulfate 325 mg once a day.  Will check CBC for current status. ?- ferrous sulfate 325 (65 FE) MG EC tablet; Take 1 tablet (325 mg total) by mouth daily with breakfast.  Dispense: 100 tablet; Refill: 3 ?- CBC w/Diff/Platelet ? ?5. Need for pneumococcal 20-valent conjugate vaccination ?Discussed and administered ?- Pneumococcal conjugate vaccine 20-valent (Prevnar 20)  ? ? ? ?

## 2022-03-23 NOTE — Patient Instructions (Signed)
GUIDELINES FOR  ?LOW-CHOLESTEROL, LOW-TRIGLYCERIDE DIETS  ?  ?FOODS TO USE  ? ?MEATS, FISH Choose lean meats (chicken, [place], veal, and non-fatty cuts of beef with excess fat trimmed; one serving = 3 oz of cooked meat). Also, fresh or frozen fish, canned fish packed in water, and shellfish (lobster, crabs, shrimp, and oysters). Limit use to no more than one serving of one of these per week. Shellfish are high in cholesterol but low in saturated fat and should be used sparingly. Meats and fish should be broiled (pan or oven) or baked on a rack.  ?EGGS Egg substitutes and egg whites (use freely). Egg yolks (limit two per week).  ?FRUITS Eat three servings of fresh fruit per day (1 serving = ? cup). Be sure to have at least one citrus fruit daily. Frozen and canned fruit with no sugar or syrup added may be used.  ?VEGETABLES Most vegetables are not limited (see next page). One dark-green (string beans, escarole) or one deep yellow (squash) vegetable is recommended daily. Cauliflower, broccoli, and celery, as well as potato skins, are recommended for their fiber content. (Fiber is associated with cholesterol reduction) It is preferable to steam vegetables, but they may be boiled, strained, or braised with polyunsaturated vegetable oil (see below).  ?BEANS Dried peas or beans (1 serving = ? cup) may be used as a bread substitute.  ?NUTS Almonds, walnuts, and peanuts may be used sparingly  ?(1 serving = 1 Tablespoonful). Use pumpkin, sesame, or sunflower seeds.  ?BREADS, GRAINS One roll or one slice of whole grain or enriched bread may be used, or three soda crackers or four pieces of melba toast as a substitute. Spaghetti, rice or noodles (? cup) or ? large ear of corn may be used as a bread substitute. In preparing these foods do not use butter or shortening, use soft margarine. Also use egg and sugar substitutes.  Choose high fiber grains, such as oats and whole wheat.  ?CEREALS Use ? cup of hot cereal or ? cup of  cold cereal per day. Add a sugar substitute if desired, with 99% fat free or skim milk.  ?MILK PRODUCTS Always use 99% fat free or skim milk, dairy products such as low fat cheeses (farmer's uncreamed diet cottage), low-fat yogurt, and powdered skim milk.  ?FATS, OILS Use soft (not stick) margarine; vegetable oils that are high in polyunsaturated fats (such as safflower, sunflower, soybean, corn, and cottonseed). Always refrigerate meat drippings to harden the fat and remove it before preparing gravies  ?DESSERTS, SNACKS Limit to two servings per day; substitute each serving for a bread/cereal serving: ice milk, water sherbet (1/4 cup); unflavored gelatin or gelatin flavored with sugar substitute (1/3 cup); pudding prepared with skim milk (1/2 cup); egg white souffl?s; unbuttered popcorn (1 ? cups). Substitute carob for chocolate.  ?BEVERAGES Fresh fruit juices (limit 4 oz per day); black coffee, plain or herbal teas; soft drinks with sugar substitutes; club soda, preferably salt-free; cocoa made with skim milk or nonfat dried milk and water (sugar substitute added if desired); clear broth. Alcohol: limit two servings per day (see second page).  ?MISCELLANEOUS ? You may use the following freely: vinegar, spices, herbs, nonfat bouillon, mustard, Worcestershire sauce, soy sauce, flavoring essence.  ? ? ? ? ? ? ? ? ? ? ? ? ? ? ? ? ?GUIDELINES FOR  ?LOW-CHOLESTEROL, LOW TRIGLYCERIDE DIETS  ?  ?FOODS TO AVOID  ? ?MEATS, FISH Marbled beef, pork, bacon, sausage, and other pork products; fatty  fowl (duck, goose); skin and fat of [place] and chicken; processed meats; luncheon meats (salami, bologna); frankfurters and fast-food hamburgers (theyre loaded with fat); organ meats (kidneys, liver); canned fish packed in oil.  ?EGGS Limit egg yolks to two per week.   ?FRUITS Coconuts (rich in saturated fats).  ?VEGETABLES Avoid avocados. Starchy vegetables (potatoes, corn, lima beans, dried peas, beans) may be used only if  substitutes for a serving of bread or cereal. (Baked potato skin, however, is desirable for its fiber content.  ?BEANS Commercial baked beans with sugar and/or pork added.  ?NUTS Avoid nuts.  Limit peanuts and walnuts to one tablespoonful per day.  ?BREADS, GRAINS Any baked goods with shortening and/or sugar. Commercial mixes with dried eggs and whole milk. Avoid sweet rolls, doughnuts, breakfast pastries (Danish), and sweetened packaged cereals (the added sugar converts readily to triglycerides).  ?MILK PRODUCTS Whole milk and whole-milk packaged goods; cream; ice cream; whole-milk puddings, yogurt, or cheeses; nondairy cream substitutes.  ?FATS, OILS Butter, lard, animal fats, bacon drippings, gravies, cream sauces as well as palm and coconut oils. All these are high in saturated fats. Examine labels on cholesterol free products for hydrogenated fats. (These are oils that have been hardened into solids and in the process have become saturated.)  ?DESSERTS, SNACKS Fried snack foods like potato chips; chocolate; candies in general; jams, jellies, syrups; whole- milk puddings; ice cream and milk sherbets; hydrogenated peanut butter.  ?BEVERAGES Sugared fruit juices and soft drinks; cocoa made with whole milk and/or sugar. When using alcohol (1 oz liquor, 5 oz beer, or 2 ? oz dry table wine per serving), one serving must be substituted for one bread or cereal serving (limit, two servings of alcohol per day).  ? SPECIAL NOTES  ?  Remember that even non-limited foods should be used in moderation. ?While on a cholesterol-lowering diet, be sure to avoid animal fats and marbled meats. ?3. While on a triglyceride-lowering diet, be sure to avoid sweets and to control the amount of carbohydrates you eat (starchy foods such as flour, bread, potatoes).While on a tri-glyceride-lowering diet, be sure to avoid sweets ?Buy a good low-fat cookbook, such as the one published by the American Heart Association. ?Consult your physician  if you have any questions.  ? ? ? ? ? ? ? ? ? ? ? ? ? ?[facility] Low Glycemic Diet Plan ? ? ?Low Glycemic Foods (20-49) Moderate Glycemic Foods (50-69) High Glycemic Foods (70-100)  ?    ?Breakfast Creals Breakfast Cereals Breakfast Cereals  ?All Bran All-Bran Fruit'n Oats  ? Bran Buds Bran Chex  ? Cheerios Corn chex  ?  ?Fiber One Oatmeal (not instant)  ? Just Right Mini-Wheats  ? Corn Flakes Cream of Wheat  ?  ?Oat Bran Special K Swiss Muesli  ? Grape Nuts Grape Nut Flakes  ?  ?  Grits Nutri-Grain  ?  ?Fruits and fruit juice: Fruits Puffed Rice Puffed Wheat  ?  ?(Limit to 1-2 Servings per day) Banana (under-ride) Dates  ? Rice Chex Rice Krispies  ?  ?Apples Apricots (fresh/dried)  ? Figs Grapes  ? Shredded Wheat Team  ?  ?Blackberries Blueberries  ? Kiwi Mango  ? Total   ?  ?Cherries Cranberries  ? Oranges Raisins  ?   ?Peaches Pears  ?  Fruits  ?Plums Prunes  ? Fruit Juices Pineapple Watermelon  ?  ?Grapefruit Raspberries  ? Cranberry Juice Orange Juice  ? Banana (over-ripe)   ?  ?Strawberries Tangerines  ?    ?  Apple Juice Grapefruit Juice  ? Beans and Legumes Beverages  ?Tomato Juice   ? [place]-type baked beans Sodas, sweet tea, pineapple juice  ? Canned pinto, kidney, or navy beans   ?Beans and Legumes (fresh-cooked) Green peas Vegetables  ?Black-eyed peas Butter Beans  ?  Potato, baked, boiled, fried, mashed  ?Chick peas Lentils  ? Vegetables French fries  ?Green beans Lima beans  ? Beets Carrots  ? Canned or frozen corn  ?Kidney beans Navy beans  ? Sweet potato Yam  ? Parsnips  ?Pinto beans Snow peas  ? Corn on the cob Winter squash  ?    ?Non-starchy vegetables Grains Breads  ?Asparagus, avocado, broccoli, cabbage Cornmeal Rice, brown  ? Most breads (white and whole grain)  ?cauliflower, celery, cucumber, greens Rice, white Couscous  ? Bagels Bread sticks  ?  ?lettuce, mushrooms, peppers, tomatoes  Bread stuffing Kaiser roll  ?  ?okra, onions, spinach, summer squash Pasta Dinner rolls  ? [place]ter, cheese  ?   ?Grains Ravioli, meat filled Spaghetti, white  ? Grains  ?Barley Bulgur  ?  Rice, instant Tapioca, with milk  ?  ?Rye Wild rice  ? Nuts   ? Cashews Macadamia  ? Candy and most cookies  ?Nuts and o

## 2022-03-24 LAB — RENAL FUNCTION PANEL
Albumin: 4.5 g/dL (ref 3.8–4.8)
BUN/Creatinine Ratio: 9 — ABNORMAL LOW (ref 10–24)
BUN: 13 mg/dL (ref 8–27)
CO2: 23 mmol/L (ref 20–29)
Calcium: 9.5 mg/dL (ref 8.6–10.2)
Chloride: 101 mmol/L (ref 96–106)
Creatinine, Ser: 1.37 mg/dL — ABNORMAL HIGH (ref 0.76–1.27)
Glucose: 154 mg/dL — ABNORMAL HIGH (ref 70–99)
Phosphorus: 2.1 mg/dL — ABNORMAL LOW (ref 2.8–4.1)
Potassium: 4.2 mmol/L (ref 3.5–5.2)
Sodium: 140 mmol/L (ref 134–144)
eGFR: 57 mL/min/{1.73_m2} — ABNORMAL LOW (ref >59)

## 2022-03-24 LAB — CBC WITH DIFFERENTIAL/PLATELET
Basophils Absolute: 0 10*3/uL (ref 0.0–0.2)
Basos: 1 %
EOS (ABSOLUTE): 0.3 10*3/uL (ref 0.0–0.4)
Eos: 6 %
Hematocrit: 40.6 % (ref 37.5–51.0)
Hemoglobin: 13.4 g/dL (ref 13.0–17.7)
Immature Grans (Abs): 0 10*3/uL (ref 0.0–0.1)
Immature Granulocytes: 0 %
Lymphocytes Absolute: 1.7 10*3/uL (ref 0.7–3.1)
Lymphs: 34 %
MCH: 27.6 pg (ref 26.6–33.0)
MCHC: 33 g/dL (ref 31.5–35.7)
MCV: 84 fL (ref 79–97)
Monocytes Absolute: 0.3 10*3/uL (ref 0.1–0.9)
Monocytes: 6 %
Neutrophils Absolute: 2.7 10*3/uL (ref 1.4–7.0)
Neutrophils: 53 %
Platelets: 201 10*3/uL (ref 150–450)
RBC: 4.85 x10E6/uL (ref 4.14–5.80)
RDW: 13.4 % (ref 11.6–15.4)
WBC: 5 10*3/uL (ref 3.4–10.8)

## 2022-03-24 LAB — LIPID PANEL WITH LDL/HDL RATIO
Cholesterol, Total: 190 mg/dL (ref 100–199)
HDL: 30 mg/dL — ABNORMAL LOW (ref >39)
LDL Chol Calc (NIH): 133 mg/dL — ABNORMAL HIGH (ref 0–99)
LDL/HDL Ratio: 4.4 ratio — ABNORMAL HIGH (ref 0.0–3.6)
Triglycerides: 146 mg/dL (ref 0–149)
VLDL Cholesterol Cal: 27 mg/dL (ref 5–40)

## 2022-03-24 LAB — HEMOGLOBIN A1C
Est. average glucose Bld gHb Est-mCnc: 146 mg/dL
Hgb A1c MFr Bld: 6.7 % — ABNORMAL HIGH (ref 4.8–5.6)

## 2022-03-25 ENCOUNTER — Other Ambulatory Visit: Payer: Self-pay

## 2022-03-25 ENCOUNTER — Other Ambulatory Visit: Payer: Self-pay | Admitting: Family Medicine

## 2022-03-25 DIAGNOSIS — E785 Hyperlipidemia, unspecified: Secondary | ICD-10-CM

## 2022-03-25 DIAGNOSIS — Z862 Personal history of diseases of the blood and blood-forming organs and certain disorders involving the immune mechanism: Secondary | ICD-10-CM

## 2022-03-25 MED ORDER — ATORVASTATIN CALCIUM 10 MG PO TABS: 10.0000 mg | ORAL_TABLET | Freq: Every day | ORAL | 0 refills | Status: DC

## 2022-03-25 MED ORDER — FERROUS SULFATE 325 (65 FE) MG PO TBEC: 325.0000 mg | DELAYED_RELEASE_TABLET | Freq: Every day | ORAL | 3 refills | Status: DC

## 2022-03-25 NOTE — Progress Notes (Signed)
Sent in atorv 

## 2022-03-25 NOTE — Telephone Encounter (Signed)
Resending to pharmacy as ordered by provider.  Requested Prescriptions  Pending Prescriptions Disp Refills  . ferrous sulfate 325 (65 FE) MG EC tablet 100 tablet 3    Sig: Take 1 tablet (325 mg total) by mouth daily with breakfast.     Endocrinology:  Minerals - Iron Supplementation Failed - 03/25/2022  1:41 PM      Failed - Fe (serum) in normal range and within 360 days    No results found for: IRON, IRONPCTSAT       Failed - Ferritin in normal range and within 360 days    Ferritin  Date Value Ref Range Status  09/26/2020 1,655 (H) 30 - 400 ng/mL Final         Passed - HGB in normal range and within 360 days    Hemoglobin  Date Value Ref Range Status  03/23/2022 13.4 13.0 - 17.7 g/dL Final         Passed - HCT in normal range and within 360 days    Hematocrit  Date Value Ref Range Status  03/23/2022 40.6 37.5 - 51.0 % Final         Passed - RBC in normal range and within 360 days    RBC  Date Value Ref Range Status  03/23/2022 4.85 4.14 - 5.80 x10E6/uL Final  07/07/2021 4.45 4.22 - 5.81 MIL/uL Final         Passed - Valid encounter within last 12 months    Recent Outpatient Visits          2 days ago Essential hypertension   [place], Deanna C, MD   6 months ago Essential hypertension   [place], Deanna C, MD   8 months ago Essential hypertension   [place], Deanna C, MD   8 months ago Essential hypertension   [place], Deanna C, MD   12 months ago Essential hypertension   [place], Deanna C, MD      Future Appointments            In 1 month Juline Patch, MD [facility], [place]   In 3 months Juline Patch, MD [facility], [place]   In 6 months Juline Patch, MD [facility], [facility]

## 2022-03-25 NOTE — Telephone Encounter (Signed)
Medication Refill - Medication:  ferrous sulfate 325 (65 FE) MG EC tablet  Has the patient contacted their pharmacy? Yes.    Contact PCP *Pharmacy didn't receive script*  Preferred Pharmacy (with phone number or street name):  [facility] [place] [address]  [place], [place] [address]  Phone:  [phone]  Fax:  [phone]  Has the patient been seen for an appointment in the last year OR does the patient have an upcoming appointment? Yes.    Agent: Please be advised that RX refills may take up to 3 business days. We ask that you follow-up with your pharmacy.

## 2022-03-29 ENCOUNTER — Other Ambulatory Visit: Payer: Self-pay

## 2022-03-29 ENCOUNTER — Telehealth: Payer: Self-pay

## 2022-03-29 DIAGNOSIS — Z86010 Personal history of colonic polyps: Secondary | ICD-10-CM

## 2022-03-29 MED ORDER — PEG 3350-KCL-NA BICARB-NACL 420 G PO SOLR: 4000.0000 mL | Freq: Once | ORAL | 0 refills | Status: AC

## 2022-03-29 NOTE — Telephone Encounter (Signed)
Gastroenterology Pre-Procedure Review  Request Date: 04/14/22 Requesting Physician: Dr. Marius Ditch  PATIENT REVIEW QUESTIONS: The patient responded to the following health history questions as indicated:    1. Are you having any GI issues? no 2. Do you have a personal history of Polyps?                3. Do you have a family history of Colon Cancer or Polyps? no 4. Diabetes Mellitus? no 5. Joint replacements in the past 12 months?no 6. Major health problems in the past 3 months?no 7. Any artificial heart valves, MVP, or defibrillator?no    MEDICATIONS & ALLERGIES:    Patient reports the following regarding taking any anticoagulation/antiplatelet therapy:   Plavix, Coumadin, Eliquis, Xarelto, Lovenox, Pradaxa, Brilinta, or Effient? no Aspirin? no  Patient confirms/reports the following medications:  Current Outpatient Medications  Medication Sig Dispense Refill   allopurinol (ZYLOPRIM) 100 MG tablet Take 50 mg by mouth daily. (Patient not taking: Reported on 03/23/2022)     aspirin EC 81 MG tablet Take 81 mg by mouth daily. Swallow whole.     atorvastatin (LIPITOR) 10 MG tablet Take 1 tablet (10 mg total) by mouth daily. 90 tablet 0   ferrous sulfate 325 (65 FE) MG EC tablet Take 1 tablet (325 mg total) by mouth daily with breakfast. 100 tablet 3   lisinopril-hydrochlorothiazide (ZESTORETIC) 10-12.5 MG tablet Take 1 tablet by mouth daily. 90 tablet 0   No current facility-administered medications for this visit.    Patient confirms/reports the following allergies:  Allergies  Allergen Reactions   Amoxicillin Rash   Ampicillin Rash    No orders of the defined types were placed in this encounter.   AUTHORIZATION INFORMATION Primary Insurance: 1D#: Group #:  Secondary Insurance: 1D#: Group #:  SCHEDULE INFORMATION: Date: 04/14/22 Time: Location: [facility]

## 2022-04-13 ENCOUNTER — Encounter: Payer: Self-pay | Admitting: Gastroenterology

## 2022-04-14 ENCOUNTER — Encounter
Admission: RE | Disposition: A | Discharge status: Home / Self Care | Payer: Self-pay | Source: Ambulatory Visit | Attending: Gastroenterology

## 2022-04-14 ENCOUNTER — Telehealth: Payer: Self-pay

## 2022-04-14 ENCOUNTER — Encounter: Payer: Self-pay | Admitting: Anesthesiology

## 2022-04-14 ENCOUNTER — Encounter: Payer: Self-pay | Admitting: Gastroenterology

## 2022-04-14 ENCOUNTER — Ambulatory Visit
Admission: RE | Admit: 2022-04-14 | Discharge: 2022-04-14 | Disposition: A | Discharge status: Home / Self Care | Payer: Medicare HMO | Source: Ambulatory Visit | Attending: Gastroenterology | Admitting: Gastroenterology

## 2022-04-14 DIAGNOSIS — Z539 Procedure and treatment not carried out, unspecified reason: Secondary | ICD-10-CM | POA: Insufficient documentation

## 2022-04-14 DIAGNOSIS — Z86010 Personal history of colonic polyps: Secondary | ICD-10-CM | POA: Insufficient documentation

## 2022-04-14 HISTORY — PX: COLONOSCOPY WITH PROPOFOL: SHX5780

## 2022-04-14 SURGERY — COLONOSCOPY WITH PROPOFOL
Anesthesia: General

## 2022-04-14 MED ORDER — SODIUM CHLORIDE 0.9 % IV SOLN
INTRAVENOUS | Status: DC
Start: 2022-04-14 — End: 2022-04-14

## 2022-04-14 NOTE — Anesthesia Preprocedure Evaluation (Deleted)
Anesthesia Evaluation  Patient identified by MRN, date of birth, ID band Patient awake    Reviewed: Allergy & Precautions, NPO status , Patient's Chart, lab work & pertinent test results  History of Anesthesia Complications Negative for: history of anesthetic complications  Airway Mallampati: III  TM Distance: >3 FB Neck ROM: Full    Dental  (+) Poor Dentition, Missing   Pulmonary neg sleep apnea, neg COPD, Current SmokerPatient did not abstain from smoking.,    Pulmonary exam normal breath sounds clear to auscultation       Cardiovascular Exercise Tolerance: Good METShypertension, (-) CAD and (-) Past MI (-) dysrhythmias  Rhythm:Regular Rate:Normal - Systolic murmurs    Neuro/Psych Residual left arm weakness CVA, Residual Symptoms negative psych ROS   GI/Hepatic neg GERD  ,(+)     (-) substance abuse  ,   Endo/Other  diabetes  Renal/GU CRFRenal disease     Musculoskeletal   Abdominal   Peds  Hematology   Anesthesia Other Findings   Reproductive/Obstetrics                             Anesthesia Physical  Anesthesia Plan  ASA: III  Anesthesia Plan: General   Post-op Pain Management:    Induction: Intravenous  PONV Risk Score and Plan: 1 and Propofol infusion and TIVA  Airway Management Planned: Nasal Cannula  Additional Equipment: None  Intra-op Plan:   Post-operative Plan:   Informed Consent:     Dental advisory given  Plan Discussed with: CRNA and Surgeon  Anesthesia Plan Comments: (Discussed risks of anesthesia with patient, including possibility of difficulty with spontaneous ventilation under anesthesia necessitating airway intervention, PONV, and rare risks such as cardiac or respiratory or neurological events. Patient understands.)        Anesthesia Quick Evaluation

## 2022-04-14 NOTE — Telephone Encounter (Signed)
Pt stated that he will call back to reschedule at a later time.  Thanks, [place], [place]

## 2022-04-14 NOTE — Telephone Encounter (Signed)
This patient no showed his colonoscopy  today with Dr. Marius Ditch can you please call him to see if he wants to reschedule procedure

## 2022-04-14 NOTE — H&P (Signed)
Patient reported liquid brown bowel movements after 2 day prep and he ate sandwich yesterday at 11AM  Recommend cologaurd test by PCP and if positive refer for colonoscopy  Sherri Sear, MD

## 2022-04-14 NOTE — Progress Notes (Signed)
Patient came in for scheduled Colonoscopy with Dr Marius Ditch this morning.  Per patient report, he drank his 2 day colon prep on Monday and Tuesday, but then ate a sandwich.  Patient reports that his stool is brown liquid.  Dr Marius Ditch spoke with the patient and they both decided that it would be rest to reschedule the Colonoscopy for another day due to poor prep.  Dr Marius Ditch spoke at length with the patient explaining the colon prep instructions.  The office will call the patient with a new date.

## 2022-04-20 ENCOUNTER — Other Ambulatory Visit: Payer: Self-pay

## 2022-04-20 DIAGNOSIS — M1A00X0 Idiopathic chronic gout, unspecified site, without tophus (tophi): Secondary | ICD-10-CM | POA: Diagnosis not present

## 2022-04-20 DIAGNOSIS — Z1211 Encounter for screening for malignant neoplasm of colon: Secondary | ICD-10-CM

## 2022-04-20 NOTE — Progress Notes (Signed)
FIT test ordered

## 2022-04-29 DIAGNOSIS — Z1211 Encounter for screening for malignant neoplasm of colon: Secondary | ICD-10-CM | POA: Diagnosis not present

## 2022-04-30 LAB — FECAL OCCULT BLOOD, IMMUNOCHEMICAL: Fecal Occult Bld: NEGATIVE

## 2022-04-30 LAB — SPECIMEN STATUS REPORT

## 2022-05-04 ENCOUNTER — Ambulatory Visit (INDEPENDENT_AMBULATORY_CARE_PROVIDER_SITE_OTHER): Payer: Medicare HMO | Admitting: Family Medicine

## 2022-05-04 ENCOUNTER — Encounter: Payer: Self-pay | Admitting: Family Medicine

## 2022-05-04 VITALS — BP 120/60 | HR 68 | Ht 79.0 in | Wt 286.0 lb

## 2022-05-04 DIAGNOSIS — I10 Essential (primary) hypertension: Secondary | ICD-10-CM

## 2022-05-04 DIAGNOSIS — Z720 Tobacco use: Secondary | ICD-10-CM | POA: Diagnosis not present

## 2022-05-04 MED ORDER — LISINOPRIL-HYDROCHLOROTHIAZIDE 10-12.5 MG PO TABS: 1.0000 | ORAL_TABLET | Freq: Every day | ORAL | 1 refills | Status: DC

## 2022-06-17 ENCOUNTER — Other Ambulatory Visit: Payer: Self-pay | Admitting: Family Medicine

## 2022-06-17 DIAGNOSIS — E785 Hyperlipidemia, unspecified: Secondary | ICD-10-CM

## 2022-06-18 NOTE — Telephone Encounter (Signed)
Requested Prescriptions  Pending Prescriptions Disp Refills  . atorvastatin (LIPITOR) 10 MG tablet [Pharmacy Med Name: Atorvastatin Calcium 10 MG Oral Tablet] 90 tablet 2    Sig: Take 1 tablet by mouth once daily     Cardiovascular:  Antilipid - Statins Failed - 06/17/2022  4:49 PM      Failed - Lipid Panel in normal range within the last 12 months    Cholesterol, Total  Date Value Ref Range Status  03/23/2022 190 100 - 199 mg/dL Final   LDL Chol Calc (NIH)  Date Value Ref Range Status  03/23/2022 133 (H) 0 - 99 mg/dL Final   HDL  Date Value Ref Range Status  03/23/2022 30 (L) >39 mg/dL Final   Triglycerides  Date Value Ref Range Status  03/23/2022 146 0 - 149 mg/dL Final         Passed - Patient is not pregnant      Passed - Valid encounter within last 12 months    Recent Outpatient Visits          1 month ago Essential hypertension   [place], Deanna C, MD   2 months ago Essential hypertension   [place], Deanna C, MD   8 months ago Essential hypertension   [place], Deanna C, MD   10 months ago Essential hypertension   [facility] Juline Patch, MD   11 months ago Essential hypertension   [place], Deanna C, MD      Future Appointments            In 2 weeks Juline Patch, MD [facility], [place]   In 3 months Juline Patch, MD [facility], [facility]

## 2022-07-08 ENCOUNTER — Ambulatory Visit: Payer: Medicare Other | Admitting: Family Medicine

## 2022-07-13 ENCOUNTER — Ambulatory Visit (INDEPENDENT_AMBULATORY_CARE_PROVIDER_SITE_OTHER): Payer: Medicare HMO | Admitting: Family Medicine

## 2022-07-13 ENCOUNTER — Encounter: Payer: Self-pay | Admitting: Family Medicine

## 2022-07-13 VITALS — BP 130/62 | HR 80 | Ht 79.0 in | Wt 288.0 lb

## 2022-07-13 DIAGNOSIS — Z6830 Body mass index (BMI) 30.0-30.9, adult: Secondary | ICD-10-CM | POA: Diagnosis not present

## 2022-07-13 DIAGNOSIS — Z0000 Encounter for general adult medical examination without abnormal findings: Secondary | ICD-10-CM | POA: Diagnosis not present

## 2022-07-13 DIAGNOSIS — R351 Nocturia: Secondary | ICD-10-CM | POA: Diagnosis not present

## 2022-07-13 DIAGNOSIS — E119 Type 2 diabetes mellitus without complications: Secondary | ICD-10-CM | POA: Diagnosis not present

## 2022-07-13 DIAGNOSIS — E785 Hyperlipidemia, unspecified: Secondary | ICD-10-CM | POA: Diagnosis not present

## 2022-07-13 DIAGNOSIS — Z125 Encounter for screening for malignant neoplasm of prostate: Secondary | ICD-10-CM | POA: Diagnosis not present

## 2022-07-13 DIAGNOSIS — Z23 Encounter for immunization: Secondary | ICD-10-CM

## 2022-07-13 DIAGNOSIS — R7303 Prediabetes: Secondary | ICD-10-CM | POA: Diagnosis not present

## 2022-07-13 DIAGNOSIS — Z720 Tobacco use: Secondary | ICD-10-CM | POA: Diagnosis not present

## 2022-07-13 NOTE — Progress Notes (Signed)
Date:  07/13/2022   Name:  Austin Dudley   DOB:  01-Dec-1955   MRN:  [id number]   Chief Complaint: Annual Exam and Flu Vaccine  Patient is a 66 year old male who presents for a comprehensive physical exam. The patient reports the following problems: none. Health maintenance has been reviewed up to date.      Lab Results  Component Value Date   NA 140 03/23/2022   K 4.2 03/23/2022   CO2 23 03/23/2022   GLUCOSE 154 (H) 03/23/2022   BUN 13 03/23/2022   CREATININE 1.37 (H) 03/23/2022   CALCIUM 9.5 03/23/2022   EGFR 57 (L) 03/23/2022   GFRNONAA 26 (L) 07/07/2021   Lab Results  Component Value Date   CHOL 190 03/23/2022   HDL 30 (L) 03/23/2022   LDLCALC 133 (H) 03/23/2022   TRIG 146 03/23/2022   Lab Results  Component Value Date   TSH 1.820 07/28/2020   Lab Results  Component Value Date   HGBA1C 6.7 (H) 03/23/2022   Lab Results  Component Value Date   WBC 5.0 03/23/2022   HGB 13.4 03/23/2022   HCT 40.6 03/23/2022   MCV 84 03/23/2022   PLT 201 03/23/2022   Lab Results  Component Value Date   ALT 25 07/28/2020   AST 30 07/28/2020   ALKPHOS 79 07/28/2020   BILITOT 1.1 07/28/2020   No results found for: "25OHVITD2", "25OHVITD3", "VD25OH"   Review of Systems  Constitutional:  Negative for chills and fever.  HENT:  Negative for drooling, ear discharge, ear pain and sore throat.   Respiratory:  Negative for cough, shortness of breath and wheezing.   Cardiovascular:  Negative for chest pain, palpitations and leg swelling.  Gastrointestinal:  Negative for abdominal pain, blood in stool, constipation, diarrhea and nausea.  Endocrine: Negative for polydipsia.  Genitourinary:  Negative for dysuria, frequency, hematuria and urgency.  Musculoskeletal:  Negative for back pain, myalgias and neck pain.  Skin:  Negative for rash.  Allergic/Immunologic: Negative for environmental allergies.  Neurological:  Negative for dizziness and headaches.  Hematological:  Does not  bruise/bleed easily.  Psychiatric/Behavioral:  Negative for suicidal ideas. The patient is not nervous/anxious.     Patient Active Problem List   Diagnosis Date Noted   Encounter for screening colonoscopy    Polyp of transverse colon    Essential hypertension 08/11/2020   Tobacco use 08/11/2020    Allergies  Allergen Reactions   Amoxicillin Rash   Ampicillin Rash    Past Surgical History:  Procedure Laterality Date   COLONOSCOPY WITH PROPOFOL N/A 09/03/2020   Procedure: COLONOSCOPY WITH PROPOFOL;  Surgeon: Virgel Manifold, MD;  Location: [facility] ENDOSCOPY;  Service: Endoscopy;  Laterality: N/A;   COLONOSCOPY WITH PROPOFOL N/A 04/14/2022   Procedure: COLONOSCOPY WITH PROPOFOL;  Surgeon: Lin Landsman, MD;  Location: [facility] ENDOSCOPY;  Service: Gastroenterology;  Laterality: N/A;    Social History   Tobacco Use   Smoking status: Some Days    Packs/day: 0.50    Years: 47.00    Total pack years: 23.50    Types: Cigarettes    Last attempt to quit: 06/27/2020    Years since quitting: 2.0   Smokeless tobacco: Never   Tobacco comments:    Per pt he's coming down slowly  Vaping Use   Vaping Use: Never used  Substance Use Topics   Alcohol use: Not Currently    Comment: quit at 77yr   Drug use: Not Currently  Medication list has been reviewed and updated.  Current Meds  Medication Sig   allopurinol (ZYLOPRIM) 100 MG tablet Take 50 mg by mouth daily.   aspirin EC 81 MG tablet Take 81 mg by mouth daily. Swallow whole.   atorvastatin (LIPITOR) 10 MG tablet Take 1 tablet by mouth once daily   ferrous sulfate 325 (65 FE) MG EC tablet Take 1 tablet (325 mg total) by mouth daily with breakfast.   lisinopril-hydrochlorothiazide (ZESTORETIC) 10-12.5 MG tablet Take 1 tablet by mouth daily.       07/13/2022    9:24 AM 05/04/2022    9:05 AM 03/23/2022    9:15 AM 09/23/2021    9:15 AM  GAD 7 : Generalized Anxiety Score  Nervous, Anxious, on Edge 0 0 0 0  Control/stop  worrying 0 0 0 0  Worry too much - different things 0 0 0 0  Trouble relaxing 0 0 0 0  Restless 0 0 0 0  Easily annoyed or irritable 0 0 0 0  Afraid - awful might happen 0 0 0 0  Total GAD 7 Score 0 0 0 0  Anxiety Difficulty Not difficult at all Not difficult at all Not difficult at all Not difficult at all       07/13/2022    9:24 AM 05/04/2022    9:04 AM 03/23/2022    9:15 AM  Depression screen PHQ 2/9  Decreased Interest 0 0 0  Down, Depressed, Hopeless 0 0 0  PHQ - 2 Score 0 0 0  Altered sleeping 0 0 0  Tired, decreased energy 0 1 0  Change in appetite 0 1 0  Feeling bad or failure about yourself  0 0 0  Trouble concentrating 0 0 0  Moving slowly or fidgety/restless 0 0 0  Suicidal thoughts 0 0 0  PHQ-9 Score 0 2 0  Difficult doing work/chores Not difficult at all Not difficult at all Not difficult at all    BP Readings from Last 3 Encounters:  07/13/22 130/62  05/04/22 120/60  03/23/22 (!) 148/86    Physical Exam Vitals and nursing note reviewed.  Constitutional:      Appearance: He is obese.  HENT:     Head: Normocephalic.     Right Ear: Hearing, tympanic membrane, ear canal and external ear normal.     Left Ear: Hearing, tympanic membrane, ear canal and external ear normal.     Nose: Nose normal.     Mouth/Throat:     Mouth: Mucous membranes are moist.     Dentition: Normal dentition.     Tongue: No lesions.     Palate: No mass.     Pharynx: Oropharynx is clear.     Tonsils: No tonsillar exudate.  Eyes:     General: Lids are normal. Vision grossly intact. Gaze aligned appropriately. No scleral icterus.       Right eye: No discharge.        Left eye: No discharge.     Conjunctiva/sclera: Conjunctivae normal.     Pupils: Pupils are equal, round, and reactive to light.     Funduscopic exam:    Right eye: Red reflex present.        Left eye: Red reflex present. Neck:     Thyroid: No thyroid mass, thyromegaly or thyroid tenderness.     Vascular: Normal  carotid pulses. No carotid bruit, hepatojugular reflux or JVD.     Trachea: Trachea and phonation normal. No tracheal deviation.  Cardiovascular:     Rate and Rhythm: Normal rate and regular rhythm.     Chest Wall: PMI is not displaced.     Pulses: Normal pulses.     Heart sounds: Normal heart sounds, S1 normal and S2 normal. No murmur heard.    No systolic murmur is present.     No diastolic murmur is present.     No friction rub. No gallop. No S3 or S4 sounds.  Pulmonary:     Effort: Pulmonary effort is normal. No respiratory distress.     Breath sounds: Normal breath sounds. No decreased breath sounds, wheezing, rhonchi or rales.  Chest:  Breasts:    Breasts are symmetrical.     Right: Normal. No mass.     Left: Normal. No mass.  Abdominal:     General: Abdomen is protuberant. Bowel sounds are normal.     Palpations: Abdomen is soft. There is no hepatomegaly, splenomegaly or mass.     Tenderness: There is no abdominal tenderness. There is no guarding or rebound.     Hernia: No hernia is present. There is no hernia in the left inguinal area or right inguinal area.  Genitourinary:    Penis: Normal.      Testes: Normal.  Musculoskeletal:        General: No tenderness. Normal range of motion.     Cervical back: Full passive range of motion without pain, normal range of motion and neck supple.     Right lower leg: No edema.     Left lower leg: No edema.  Lymphadenopathy:     Cervical: No cervical adenopathy.     Right cervical: No superficial, deep or posterior cervical adenopathy.    Left cervical: No superficial, deep or posterior cervical adenopathy.     Upper Body:     Right upper body: No supraclavicular or axillary adenopathy.     Left upper body: No supraclavicular or axillary adenopathy.  Skin:    General: Skin is warm.     Capillary Refill: Capillary refill takes less than 2 seconds.     Findings: No rash.  Neurological:     Mental Status: He is alert and oriented to  person, place, and time.     Cranial Nerves: Cranial nerves 2-12 are intact. No cranial nerve deficit.     Sensory: Sensation is intact.     Motor: Motor function is intact.     Coordination: Romberg sign negative.     Deep Tendon Reflexes:     Reflex Scores:      Bicep reflexes are 2+ on the right side and 1+ on the left side.      Brachioradialis reflexes are 2+ on the right side and 1+ on the left side.      Patellar reflexes are 2+ on the right side and 1+ on the left side. Psychiatric:        Behavior: Behavior is cooperative.     Wt Readings from Last 3 Encounters:  07/13/22 288 lb (130.6 kg)  05/04/22 286 lb (129.7 kg)  03/23/22 290 lb (131.5 kg)    BP 130/62   Pulse 80   Ht '6\' 7"'  (2.007 m)   Wt 288 lb (130.6 kg)   BMI 32.44 kg/m   Assessment and Plan:  Paras Kreider is a 66 y.o. male who presents today for his Complete Annual Exam. He feels well. He reports exercising active. He reports he is sleeping well.  Immunizations are reviewed and  recommendations provided.   Age appropriate screening tests are discussed. Counseling given for risk factor reduction interventions.  1. Annual physical exam No subjective history of present illness, review of past medical history and medical notes, review of systems, and physical exam.  Reviewed previous labs acceptable except patient needs a repeat of her lipid panel - PSA - HgB A1c - Lipid Panel With LDL/HDL Ratio  2. Tobacco use Patient has been advised of the health risks of smoking and counseled concerning cessation of tobacco products. I spent over 3 minutes for discussion and to answer questions.   3. Prediabetes Chronic.  Persistent.  Elevated A1c in prediabetic range and we will repeat. - HgB A1c  4. Nocturia Nocturia we will check PSA.  DRE was negative size shape consistency of prostate.  5. BMI 30.0-30.9,adult Health risks of being over weight were discussed and patient was counseled on weight loss options and  exercise.   6. Need for immunization against influenza Discussed and recommended - Flu Vaccine QUAD 65moIM (Fluarix, Fluzone & Alfiuria Quad PF)   DOtilio Miu MD

## 2022-07-13 NOTE — Patient Instructions (Signed)

## 2022-07-14 ENCOUNTER — Other Ambulatory Visit: Payer: Self-pay

## 2022-07-14 DIAGNOSIS — E119 Type 2 diabetes mellitus without complications: Secondary | ICD-10-CM

## 2022-07-14 LAB — PSA: Prostate Specific Ag, Serum: 3.7 ng/mL (ref 0.0–4.0)

## 2022-07-14 LAB — LIPID PANEL WITH LDL/HDL RATIO
Cholesterol, Total: 104 mg/dL (ref 100–199)
HDL: 27 mg/dL — ABNORMAL LOW (ref >39)
LDL Chol Calc (NIH): 54 mg/dL (ref 0–99)
LDL/HDL Ratio: 2 ratio (ref 0.0–3.6)
Triglycerides: 130 mg/dL (ref 0–149)
VLDL Cholesterol Cal: 23 mg/dL (ref 5–40)

## 2022-07-14 LAB — HEMOGLOBIN A1C
Est. average glucose Bld gHb Est-mCnc: 157 mg/dL
Hgb A1c MFr Bld: 7.1 % — ABNORMAL HIGH (ref 4.8–5.6)

## 2022-07-14 MED ORDER — METFORMIN HCL 500 MG PO TABS: 500.0000 mg | ORAL_TABLET | Freq: Two times a day (BID) | ORAL | 1 refills | Status: DC

## 2022-07-14 NOTE — Progress Notes (Signed)
Sent in metformin to [facility] [address]

## 2022-07-16 ENCOUNTER — Telehealth: Payer: Self-pay | Admitting: Family Medicine

## 2022-07-16 NOTE — Telephone Encounter (Signed)
Copied from [place] [id number]. Topic: Medicare AWV >> Jul 16, 2022 10:43 AM Jae Dire wrote: Reason for CRM:  No answer unable to leave a  message for patient to call back and schedule Medicare Annual Wellness Visit (AWV) in office.   If unable to come into the office for AWV,  please offer to do virtually or by telephone.  Last AWV:  07/25/2021  Please schedule at anytime with [facility] Health Advisor.      30 minute appointment for Virtual or phone 45 minute appointment for in office or Initial virtual/phone  Any questions, please call me at [phone]

## 2022-08-19 DIAGNOSIS — E119 Type 2 diabetes mellitus without complications: Secondary | ICD-10-CM | POA: Insufficient documentation

## 2022-09-10 ENCOUNTER — Ambulatory Visit (INDEPENDENT_AMBULATORY_CARE_PROVIDER_SITE_OTHER): Payer: Medicare HMO

## 2022-09-10 ENCOUNTER — Other Ambulatory Visit: Payer: Self-pay

## 2022-09-10 VITALS — BP 112/68 | HR 94 | Temp 97.8°F | Resp 20 | Ht 79.0 in | Wt 275.8 lb

## 2022-09-10 DIAGNOSIS — I10 Essential (primary) hypertension: Secondary | ICD-10-CM

## 2022-09-10 DIAGNOSIS — Z0000 Encounter for general adult medical examination without abnormal findings: Secondary | ICD-10-CM | POA: Diagnosis not present

## 2022-09-10 DIAGNOSIS — E119 Type 2 diabetes mellitus without complications: Secondary | ICD-10-CM

## 2022-09-10 MED ORDER — METFORMIN HCL 500 MG PO TABS: 500.0000 mg | ORAL_TABLET | Freq: Two times a day (BID) | ORAL | 0 refills | Status: DC

## 2022-09-10 MED ORDER — LISINOPRIL-HYDROCHLOROTHIAZIDE 10-12.5 MG PO TABS: 1.0000 | ORAL_TABLET | Freq: Every day | ORAL | 0 refills | Status: DC

## 2022-09-10 NOTE — Progress Notes (Signed)
Subjective:   Austin Dudley is a 66 y.o. male who presents for Medicare Annual/Subsequent preventive examination.  Review of Systems    Per HPI unless specifically indicated below.        Objective:    Today's Vitals   09/10/22 0930 09/10/22 0939  BP: 112/68   Pulse: 94   Resp: 20   Temp: 97.8 F (36.6 C)   TempSrc: Oral   SpO2: 95%   Weight: 275 lb 12.8 oz (125.1 kg)   Height: '6\' 7"'$  (2.007 m)   PainSc: 0-No pain 0-No pain   Body mass index is 31.07 kg/m.     07/25/2021   11:10 AM 09/03/2020    9:05 AM  Advanced Directives  Does Patient Have a Medical Advance Directive? No No  Would patient like information on creating a medical advance directive? No - Patient declined No - Patient declined    Current Medications (verified) Outpatient Encounter Medications as of 09/10/2022  Medication Sig   allopurinol (ZYLOPRIM) 100 MG tablet Take 50 mg by mouth daily.   aspirin EC 81 MG tablet Take 81 mg by mouth daily. Swallow whole.   atorvastatin (LIPITOR) 10 MG tablet Take 1 tablet by mouth once daily   lisinopril-hydrochlorothiazide (ZESTORETIC) 10-12.5 MG tablet Take 1 tablet by mouth daily.   metFORMIN (GLUCOPHAGE) 500 MG tablet Take 1 tablet (500 mg total) by mouth 2 (two) times daily with a meal.   ferrous sulfate 325 (65 FE) MG EC tablet Take 1 tablet (325 mg total) by mouth daily with breakfast. (Patient not taking: Reported on 09/10/2022)   No facility-administered encounter medications on file as of 09/10/2022.    Allergies (verified) Amoxicillin and Ampicillin   History: Past Medical History:  Diagnosis Date   Chronic kidney disease    Vertigo    Past Surgical History:  Procedure Laterality Date   COLONOSCOPY WITH PROPOFOL N/A 09/03/2020   Procedure: COLONOSCOPY WITH PROPOFOL;  Surgeon: Virgel Manifold, MD;  Location: [facility] ENDOSCOPY;  Service: Endoscopy;  Laterality: N/A;   COLONOSCOPY WITH PROPOFOL N/A 04/14/2022   Procedure: COLONOSCOPY WITH  PROPOFOL;  Surgeon: Lin Landsman, MD;  Location: [facility] ENDOSCOPY;  Service: Gastroenterology;  Laterality: N/A;   Family History  Problem Relation Age of Onset   Stroke Father    Diabetes Sister    Social History   Socioeconomic History   Marital status: Single    Spouse name: N/A   Number of children: 1   Years of education: Eleven   Highest education level: 11th grade  Occupational History   Occupation: retired  Tobacco Use   Smoking status: Some Days    Packs/day: 0.25    Years: 47.00    Total pack years: 11.75    Types: Cigarettes    Last attempt to quit: 06/27/2020    Years since quitting: 2.2   Smokeless tobacco: Never   Tobacco comments:    Per pt he's coming down slowly  Vaping Use   Vaping Use: Never used  Substance and Sexual Activity   Alcohol use: Not Currently    Comment: quit at 56yr   Drug use: Not Currently   Sexual activity: Not Currently  Other Topics Concern   Not on file  Social History Narrative   Not on file   Social Determinants of Health   Financial Resource Strain: Low Risk  (09/10/2022)   Overall Financial Resource Strain (CARDIA)    Difficulty of Paying Living Expenses: Not hard at all  Food Insecurity: No Food Insecurity (09/10/2022)   Hunger Vital Sign    Worried About Running Out of Food in the Last Year: Never true    Ran Out of Food in the Last Year: Never true  Transportation Needs: No Transportation Needs (09/10/2022)   PRAPARE - Hydrologist (Medical): No    Lack of Transportation (Non-Medical): No  Physical Activity: Inactive (09/10/2022)   Exercise Vital Sign    Days of Exercise per Week: 0 days    Minutes of Exercise per Session: 0 min  Stress: No Stress Concern Present (09/10/2022)   [place]    Feeling of Stress : Not at all  Social Connections: Socially Isolated (09/10/2022)   Social Connection and Isolation Panel  [NHANES]    Frequency of Communication with Friends and Family: More than three times a week    Frequency of Social Gatherings with Friends and Family: Once a week    Attends Religious Services: Never    Marine scientist or Organizations: No    Attends Music therapist: Never    Marital Status: Never married    Tobacco Counseling Ready to quit: Yes Counseling given: Yes Tobacco comments: Per pt he's coming down slowly   Clinical Intake:  Pre-visit preparation completed: No  Pain : No/denies pain Pain Score: 0-No pain     Nutritional Status: BMI > 30  Obese Nutritional Risks: None Diabetes: Yes CBG done?: No Did pt. bring in CBG monitor from home?: No  How often do you need to have someone help you when you read instructions, pamphlets, or other written materials from your doctor or pharmacy?: 1 - Never  Diabetic?Nutrition Risk Assessment:  Has the patient had any N/V/D within the last 2 months?  No  Does the patient have any non-healing wounds?  No  Has the patient had any unintentional weight loss or weight gain?  No   Diabetes:  Is the patient diabetic?  Yes  If diabetic, was a CBG obtained today?  No  Did the patient bring in their glucometer from home?  No  How often do you monitor your CBG's? Never .   Financial Strains and Diabetes Management:  Are you having any financial strains with the device, your supplies or your medication? No .  Does the patient want to be seen by Chronic Care Management for management of their diabetes?  No  Would the patient like to be referred to a Nutritionist or for Diabetic Management?  No   Diabetic Exams:  Diabetic Eye Exam: Overdue for diabetic eye exam. Pt has been advised about the importance in completing this exam. Patient advised to call and schedule an eye exam. Diabetic Foot Exam: Overdue, Pt has been advised about the importance in completing this exam. Pt is scheduled for diabetic foot exam on  next appt.   Interpreter Needed?: No  Information entered by :: Donnie Mesa, cMA   Activities of Daily Living    09/10/2022    9:37 AM  In your present state of health, do you have any difficulty performing the following activities:  Hearing? 0  Vision? 0  Difficulty concentrating or making decisions? 0  Walking or climbing stairs? 1  Dressing or bathing? 0  Doing errands, shopping? 0    Patient Care Team: Juline Patch, MD as PCP - General (Family Medicine)  Indicate any recent Medical Services you may have received from  other than [facility] providers in the past year (date may be approximate). No hospitalization in the past 12 months.     Assessment:   This is a routine wellness examination for [place].  Hearing/Vision screen Denies any hearing issues. Admits to some changes in his vision. Overdue for an Eye Exam. The pt stated that he will schedule an appt. Dietary issues and exercise activities discussed: Current Exercise Habits: The patient does not participate in regular exercise at present, Exercise limited by: None identified   Goals Addressed   None    Depression Screen    09/10/2022    9:36 AM 07/13/2022    9:24 AM 05/04/2022    9:04 AM 03/23/2022    9:15 AM 09/23/2021    9:15 AM 07/28/2021   10:14 AM 07/25/2021    9:42 AM  PHQ 2/9 Scores  PHQ - 2 Score 0 0 0 0 0 0 0  PHQ- 9 Score 0 0 2 0 0 0 0    Fall Risk    09/10/2022    9:37 AM 07/13/2022    9:24 AM 03/23/2022    9:15 AM 09/23/2021    9:15 AM 07/28/2021   10:14 AM  Fall Risk   Falls in the past year? 0 0 0 0 0  Number falls in past yr: 0 0 0 0 0  Injury with Fall? 0 0 0 0 0  Risk for fall due to : No Fall Risks No Fall Risks No Fall Risks No Fall Risks No Fall Risks  Follow up Falls evaluation completed Falls evaluation completed Falls evaluation completed Falls evaluation completed Falls evaluation completed    FALL RISK PREVENTION PERTAINING TO THE HOME:  Any stairs in or around the home? Yes   If so, are there any without handrails? No  Home free of loose throw rugs in walkways, pet beds, electrical cords, etc? Yes Adequate lighting in your home to reduce risk of falls? Yes  ASSISTIVE DEVICES UTILIZED TO PREVENT FALLS:  Life alert? No  Use of a cane, walker or w/c? No  Grab bars in the bathroom? No  Shower chair or bench in shower? Yes  Elevated toilet seat or a handicapped toilet? No   TIMED UP AND GO:  Was the test performed? Yes .  Length of time to ambulate 10 feet: 10 sec.   Gait steady and fast without use of assistive device  Cognitive Function:        09/10/2022    9:38 AM 07/25/2021    9:55 AM  6CIT Screen  What Year? 0 points 0 points  What month? 0 points 0 points  What time? 0 points 0 points  Count back from 20 0 points 0 points  Months in reverse 0 points 0 points  Repeat phrase 2 points 0 points  Total Score 2 points 0 points    Immunizations Immunization History  Administered Date(s) Administered   Fluad Quad(high Dose 65+) 07/28/2021   Influenza,inj,Quad PF,6+ Mos 07/13/2022   PNEUMOCOCCAL CONJUGATE-20 03/23/2022    TDAP status: Due, Education has been provided regarding the importance of this vaccine. Advised may receive this vaccine at local pharmacy or Health Dept. Aware to provide a copy of the vaccination record if obtained from local pharmacy or Health Dept. Verbalized acceptance and understanding.  Flu Vaccine status: Up to date  Pneumococcal vaccine status: Up to date  Covid-19 vaccine status: Declined, Education has been provided regarding the importance of this vaccine but patient still declined.  Advised may receive this vaccine at local pharmacy or Health Dept.or vaccine clinic. Aware to provide a copy of the vaccination record if obtained from local pharmacy or Health Dept. Verbalized acceptance and understanding.  Qualifies for Shingles Vaccine? Yes   Zostavax completed No   Shingrix Completed?: No.    Education has been  provided regarding the importance of this vaccine. Patient has been advised to call insurance company to determine out of pocket expense if they have not yet received this vaccine. Advised may also receive vaccine at local pharmacy or Health Dept. Verbalized acceptance and understanding.  Screening Tests Health Maintenance  Topic Date Due   FOOT EXAM  Never done   OPHTHALMOLOGY EXAM  Never done   Diabetic kidney evaluation - Urine ACR  Never done   TETANUS/TDAP  Never done   Zoster Vaccines- Shingrix (1 of 2) 10/12/2022 (Originally 02/23/1975)   HEMOGLOBIN A1C  01/11/2023   Diabetic kidney evaluation - GFR measurement  03/24/2023   COLONOSCOPY (Pts 45-9yr Insurance coverage will need to be confirmed)  04/15/2023   Medicare Annual Wellness (AWV)  09/11/2023   Pneumonia Vaccine 66 Years old  Completed   INFLUENZA VACCINE  Completed   HPV VACCINES  Aged Out   Lung Cancer Screening  Discontinued   COVID-19 Vaccine  Discontinued   Hepatitis C Screening  Discontinued    Health Maintenance  Health Maintenance Due  Topic Date Due   FOOT EXAM  Never done   OPHTHALMOLOGY EXAM  Never done   Diabetic kidney evaluation - Urine ACR  Never done   TETANUS/TDAP  Never done    Colorectal cancer screening: Type of screening: Colonoscopy. Completed 04/14/2022. Repeat every 1 years  Lung Cancer Screening: (Low Dose CT Chest recommended if Age 67-80years, 30 pack-year currently smoking OR have quit w/in 15years.) does qualify.   Lung Cancer Screening Referral:   Additional Screening:  Hepatitis C Screening: does qualify  Vision Screening: Recommended annual ophthalmology exams for early detection of glaucoma and other disorders of the eye. Is the patient up to date with their annual eye exam?  No  Who is the provider or what is the name of the office in which the patient attends annual eye exams?  If pt is not established with a provider, would they like to be referred to a provider to  establish care? No .   Dental Screening: Recommended annual dental exams for proper oral hygiene  Community Resource Referral / Chronic Care Management: CRR required this visit?  No   CCM required this visit?  No      Plan:     I have personally reviewed and noted the following in the patient's chart:   Medical and social history Use of alcohol, tobacco or illicit drugs  Current medications and supplements including opioid prescriptions. Patient is not currently taking opioid prescriptions. Functional ability and status Nutritional status Physical activity Advanced directives List of other physicians Hospitalizations, surgeries, and ER visits in previous 12 months Vitals Screenings to include cognitive, depression, and falls Referrals and appointments  In addition, I have reviewed and discussed with patient certain preventive protocols, quality metrics, and best practice recommendations. A written personalized care plan for preventive services as well as general preventive health recommendations were provided to patient.     Mr. CShimabukuro, Thank you for taking time to come for your Medicare Wellness Visit. I appreciate your ongoing commitment to your health goals. Please review the following plan we discussed and  let me know if I can assist you in the future.   These are the goals we discussed:  Goals   None     This is a list of the screening recommended for you and due dates:  Health Maintenance  Topic Date Due   Complete foot exam   Never done   Eye exam for diabetics  Never done   Yearly kidney health urinalysis for diabetes  Never done   Tetanus Vaccine  Never done   Zoster (Shingles) Vaccine (1 of 2) 10/12/2022*   Hemoglobin A1C  01/11/2023   Yearly kidney function blood test for diabetes  03/24/2023   Colon Cancer Screening  04/15/2023   Medicare Annual Wellness Visit  09/11/2023   Pneumonia Vaccine  Completed   Flu Shot  Completed   HPV Vaccine  Aged Out    Screening for Lung Cancer  Discontinued   COVID-19 Vaccine  Discontinued   Hepatitis C Screening: USPSTF Recommendation to screen - Ages 54-79 yo.  Discontinued  *Topic was postponed. The date shown is not the original due date.      Wilson Singer, [place]   09/10/2022   Nurse Notes: Approximately 30 minute Face-To-Face Visit.

## 2022-09-10 NOTE — Patient Instructions (Signed)
Health Maintenance, Male Adopting a healthy lifestyle and getting preventive care are important in promoting health and wellness. Ask your health care provider about: The right schedule for you to have regular tests and exams. Things you can do on your own to prevent diseases and keep yourself healthy. What should I know about diet, weight, and exercise? Eat a healthy diet  Eat a diet that includes plenty of vegetables, fruits, low-fat dairy products, and lean protein. Do not eat a lot of foods that are high in solid fats, added sugars, or sodium. Maintain a healthy weight Body mass index (BMI) is a measurement that can be used to identify possible weight problems. It estimates body fat based on height and weight. Your health care provider can help determine your BMI and help you achieve or maintain a healthy weight. Get regular exercise Get regular exercise. This is one of the most important things you can do for your health. Most adults should: Exercise for at least 150 minutes each week. The exercise should increase your heart rate and make you sweat (moderate-intensity exercise). Do strengthening exercises at least twice a week. This is in addition to the moderate-intensity exercise. Spend less time sitting. Even light physical activity can be beneficial. Watch cholesterol and blood lipids Have your blood tested for lipids and cholesterol at 67 years of age, then have this test every 5 years. You may need to have your cholesterol levels checked more often if: Your lipid or cholesterol levels are high. You are older than 67 years of age. You are at high risk for heart disease. What should I know about cancer screening? Many types of cancers can be detected early and may often be prevented. Depending on your health history and family history, you may need to have cancer screening at various ages. This may include screening for: Colorectal cancer. Prostate cancer. Skin cancer. Lung  cancer. What should I know about heart disease, diabetes, and high blood pressure? Blood pressure and heart disease High blood pressure causes heart disease and increases the risk of stroke. This is more likely to develop in people who have high blood pressure readings or are overweight. Talk with your health care provider about your target blood pressure readings. Have your blood pressure checked: Every 3-5 years if you are 18-39 years of age. Every year if you are 40 years old or older. If you are between the ages of 65 and 75 and are a current or former smoker, ask your health care provider if you should have a one-time screening for abdominal aortic aneurysm (AAA). Diabetes Have regular diabetes screenings. This checks your fasting blood sugar level. Have the screening done: Once every three years after age 45 if you are at a normal weight and have a low risk for diabetes. More often and at a younger age if you are overweight or have a high risk for diabetes. What should I know about preventing infection? Hepatitis B If you have a higher risk for hepatitis B, you should be screened for this virus. Talk with your health care provider to find out if you are at risk for hepatitis B infection. Hepatitis C Blood testing is recommended for: Everyone born from 1945 through 1965. Anyone with known risk factors for hepatitis C. Sexually transmitted infections (STIs) You should be screened each year for STIs, including gonorrhea and chlamydia, if: You are sexually active and are younger than 67 years of age. You are older than 67 years of age and your   health care provider tells you that you are at risk for this type of infection. Your sexual activity has changed since you were last screened, and you are at increased risk for chlamydia or gonorrhea. Ask your health care provider if you are at risk. Ask your health care provider about whether you are at high risk for HIV. Your health care provider  may recommend a prescription medicine to help prevent HIV infection. If you choose to take medicine to prevent HIV, you should first get tested for HIV. You should then be tested every 3 months for as long as you are taking the medicine. Follow these instructions at home: Alcohol use Do not drink alcohol if your health care provider tells you not to drink. If you drink alcohol: Limit how much you have to 0-2 drinks a day. Know how much alcohol is in your drink. In the [place] one drink equals one 12 oz bottle of beer (355 mL), one 5 oz glass of wine (148 mL), or one 1 oz glass of hard liquor (44 mL). Lifestyle Do not use any products that contain nicotine or tobacco. These products include cigarettes, chewing tobacco, and vaping devices, such as e-cigarettes. If you need help quitting, ask your health care provider. Do not use street drugs. Do not share needles. Ask your health care provider for help if you need support or information about quitting drugs. General instructions Schedule regular health, dental, and eye exams. Stay current with your vaccines. Tell your health care provider if: You often feel depressed. You have ever been abused or do not feel safe at home. Summary Adopting a healthy lifestyle and getting preventive care are important in promoting health and wellness. Follow your health care provider's instructions about healthy diet, exercising, and getting tested or screened for diseases. Follow your health care provider's instructions on monitoring your cholesterol and blood pressure. This information is not intended to replace advice given to you by your health care provider. Make sure you discuss any questions you have with your health care provider. Document Revised: 03/16/2021 Document Reviewed: 03/16/2021 Elsevier Patient Education  2023 [employer]  

## 2022-09-23 ENCOUNTER — Ambulatory Visit (INDEPENDENT_AMBULATORY_CARE_PROVIDER_SITE_OTHER): Payer: Medicare HMO | Admitting: Family Medicine

## 2022-09-23 ENCOUNTER — Encounter: Payer: Self-pay | Admitting: Family Medicine

## 2022-09-23 VITALS — BP 112/68 | HR 96 | Ht 79.0 in | Wt 278.0 lb

## 2022-09-23 DIAGNOSIS — Z862 Personal history of diseases of the blood and blood-forming organs and certain disorders involving the immune mechanism: Secondary | ICD-10-CM

## 2022-09-23 DIAGNOSIS — E119 Type 2 diabetes mellitus without complications: Secondary | ICD-10-CM

## 2022-09-23 DIAGNOSIS — K5903 Drug induced constipation: Secondary | ICD-10-CM | POA: Diagnosis not present

## 2022-09-23 MED ORDER — DOCUSATE SODIUM 50 MG PO CAPS: 50.0000 mg | ORAL_CAPSULE | Freq: Two times a day (BID) | ORAL | 1 refills | Status: DC

## 2022-09-23 MED ORDER — METFORMIN HCL 500 MG PO TABS: 500.0000 mg | ORAL_TABLET | Freq: Two times a day (BID) | ORAL | 5 refills | Status: DC

## 2022-09-23 NOTE — Progress Notes (Signed)
Date:  09/23/2022   Name:  Austin Dudley   DOB:  September 01, 1956   MRN:  [id number]   Chief Complaint: Diabetes  Diabetes He presents for his follow-up diabetic visit. He has type 2 diabetes mellitus. His disease course has been stable. There are no hypoglycemic associated symptoms. Pertinent negatives for hypoglycemia include no dizziness, headaches or nervousness/anxiousness. There are no diabetic associated symptoms. Pertinent negatives for diabetes include no chest pain, no polydipsia and no weight loss. There are no hypoglycemic complications. Symptoms are stable. There are no diabetic complications. Current diabetic treatment includes oral agent (monotherapy). He is compliant with treatment most of the time.  Anemia Presents for follow-up visit. There has been no abdominal pain, bruising/bleeding easily, fever, palpitations or weight loss. Signs of blood loss that are not present include hematemesis, hematochezia and melena. There are no compliance problems.     Lab Results  Component Value Date   NA 140 03/23/2022   K 4.2 03/23/2022   CO2 23 03/23/2022   GLUCOSE 154 (H) 03/23/2022   BUN 13 03/23/2022   CREATININE 1.37 (H) 03/23/2022   CALCIUM 9.5 03/23/2022   EGFR 57 (L) 03/23/2022   GFRNONAA 26 (L) 07/07/2021   Lab Results  Component Value Date   CHOL 104 07/13/2022   HDL 27 (L) 07/13/2022   LDLCALC 54 07/13/2022   TRIG 130 07/13/2022   Lab Results  Component Value Date   TSH 1.820 07/28/2020   Lab Results  Component Value Date   HGBA1C 7.1 (H) 07/13/2022   Lab Results  Component Value Date   WBC 5.0 03/23/2022   HGB 13.4 03/23/2022   HCT 40.6 03/23/2022   MCV 84 03/23/2022   PLT 201 03/23/2022   Lab Results  Component Value Date   ALT 25 07/28/2020   AST 30 07/28/2020   ALKPHOS 79 07/28/2020   BILITOT 1.1 07/28/2020   No results found for: "25OHVITD2", "25OHVITD3", "VD25OH"   Review of Systems  Constitutional:  Negative for chills, fever and weight  loss.  HENT:  Negative for drooling, ear discharge, ear pain and sore throat.   Respiratory:  Negative for cough, shortness of breath and wheezing.   Cardiovascular:  Negative for chest pain, palpitations and leg swelling.  Gastrointestinal:  Negative for abdominal pain, blood in stool, constipation, diarrhea, hematemesis, hematochezia, melena and nausea.  Endocrine: Negative for polydipsia.  Genitourinary:  Negative for dysuria, frequency, hematuria and urgency.  Musculoskeletal:  Negative for back pain, myalgias and neck pain.  Skin:  Negative for rash.  Allergic/Immunologic: Negative for environmental allergies.  Neurological:  Negative for dizziness and headaches.  Hematological:  Does not bruise/bleed easily.  Psychiatric/Behavioral:  Negative for suicidal ideas. The patient is not nervous/anxious.     Patient Active Problem List   Diagnosis Date Noted   Type 2 diabetes mellitus without complications ([place]) 01/20/9457   Encounter for screening colonoscopy    Polyp of transverse colon    Essential hypertension 08/11/2020   Tobacco use 08/11/2020    Allergies  Allergen Reactions   Amoxicillin Rash   Ampicillin Rash    Past Surgical History:  Procedure Laterality Date   COLONOSCOPY WITH PROPOFOL N/A 09/03/2020   Procedure: COLONOSCOPY WITH PROPOFOL;  Surgeon: Virgel Manifold, MD;  Location: [facility] ENDOSCOPY;  Service: Endoscopy;  Laterality: N/A;   COLONOSCOPY WITH PROPOFOL N/A 04/14/2022   Procedure: COLONOSCOPY WITH PROPOFOL;  Surgeon: Lin Landsman, MD;  Location: [facility] ENDOSCOPY;  Service: Gastroenterology;  Laterality: N/A;  Social History   Tobacco Use   Smoking status: Some Days    Packs/day: 0.25    Years: 47.00    Total pack years: 11.75    Types: Cigarettes    Last attempt to quit: 06/27/2020    Years since quitting: 2.2   Smokeless tobacco: Never   Tobacco comments:    Per pt he's coming down slowly  Vaping Use   Vaping Use: Never used   Substance Use Topics   Alcohol use: Not Currently    Comment: quit at 10yr   Drug use: Not Currently     Medication list has been reviewed and updated.  Current Meds  Medication Sig   allopurinol (ZYLOPRIM) 100 MG tablet Take 50 mg by mouth daily.   aspirin EC 81 MG tablet Take 81 mg by mouth daily. Swallow whole.   atorvastatin (LIPITOR) 10 MG tablet Take 1 tablet by mouth once daily   lisinopril-hydrochlorothiazide (ZESTORETIC) 10-12.5 MG tablet Take 1 tablet by mouth daily.   metFORMIN (GLUCOPHAGE) 500 MG tablet Take 1 tablet (500 mg total) by mouth 2 (two) times daily with a meal.       09/23/2022    9:11 AM 07/13/2022    9:24 AM 05/04/2022    9:05 AM 03/23/2022    9:15 AM  GAD 7 : Generalized Anxiety Score  Nervous, Anxious, on Edge 0 0 0 0  Control/stop worrying 0 0 0 0  Worry too much - different things 0 0 0 0  Trouble relaxing 0 0 0 0  Restless 0 0 0 0  Easily annoyed or irritable 0 0 0 0  Afraid - awful might happen 0 0 0 0  Total GAD 7 Score 0 0 0 0  Anxiety Difficulty Not difficult at all Not difficult at all Not difficult at all Not difficult at all       09/23/2022    9:11 AM 09/10/2022    9:36 AM 07/13/2022    9:24 AM  Depression screen PHQ 2/9  Decreased Interest 0 0 0  Down, Depressed, Hopeless 0 0 0  PHQ - 2 Score 0 0 0  Altered sleeping 0 0 0  Tired, decreased energy 0 0 0  Change in appetite 0 0 0  Feeling bad or failure about yourself  0 0 0  Trouble concentrating 0 0 0  Moving slowly or fidgety/restless 0 0 0  Suicidal thoughts 0 0 0  PHQ-9 Score 0 0 0  Difficult doing work/chores Not difficult at all Not difficult at all Not difficult at all    BP Readings from Last 3 Encounters:  09/23/22 112/68  09/10/22 112/68  07/13/22 130/62    Physical Exam Vitals and nursing note reviewed.  HENT:     Head: Normocephalic.     Right Ear: Tympanic membrane and external ear normal.     Left Ear: Tympanic membrane and external ear normal.      Nose: Nose normal.     Mouth/Throat:     Mouth: Mucous membranes are moist.  Eyes:     General: No scleral icterus.       Right eye: No discharge.        Left eye: No discharge.     Conjunctiva/sclera: Conjunctivae normal.     Pupils: Pupils are equal, round, and reactive to light.  Neck:     Thyroid: No thyromegaly.     Vascular: No JVD.     Trachea: No tracheal deviation.  Cardiovascular:  Rate and Rhythm: Normal rate and regular rhythm.     Heart sounds: Normal heart sounds. No murmur heard.    No friction rub. No gallop.  Pulmonary:     Effort: No respiratory distress.     Breath sounds: Normal breath sounds. No wheezing, rhonchi or rales.  Abdominal:     General: Bowel sounds are normal.     Palpations: Abdomen is soft. There is no mass.     Tenderness: There is no abdominal tenderness. There is no guarding or rebound.  Musculoskeletal:        General: No tenderness. Normal range of motion.     Cervical back: Normal range of motion and neck supple.  Lymphadenopathy:     Cervical: No cervical adenopathy.  Skin:    General: Skin is warm.     Findings: No rash.  Neurological:     Mental Status: He is alert.    Wt Readings from Last 3 Encounters:  09/23/22 278 lb (126.1 kg)  09/10/22 275 lb 12.8 oz (125.1 kg)  07/13/22 288 lb (130.6 kg)    BP 112/68   Pulse 96   Ht _0  (2.007 m)   Wt 278 lb (126.1 kg)   SpO2 99%   BMI 31.32 kg/m   Assessment and Plan:  1. New onset type 2 diabetes mellitus (HCC) Chronic.  Controlled.  Stable.  Tolerating medications well with no hypoglycemic episodes.  We will continue metformin 500 mg twice a day.  We will check A1c and microalbuminuria.  Foot exam today is normal.  We will schedule for ophthalmology evaluation for diabetic retinopathy. - metFORMIN (GLUCOPHAGE) 500 MG tablet; Take 1 tablet (500 mg total) by mouth 2 (two) times daily with a meal.  Dispense: 60 tablet; Refill: 5 - HgB A1c - Microalbumin / creatinine urine  ratio - Ambulatory referral to Ophthalmology  2. History of anemia .  Controlled.  Stable.  Patient needs to be encouraged to continue to take his iron supplementation we will check CBC and ferritin for level of control. - CBC w/Diff/Platelet - Ferritin  3. Drug-induced constipation he does complain that the iron causes constipation and Colace will be suggested on a daily basis with his iron supplementation. - docusate sodium (COLACE) 50 MG capsule; Take 1 capsule (50 mg total) by mouth 2 (two) times daily.  Dispense: 90 capsule; Refill: 1    Otilio Miu, MD

## 2022-09-24 LAB — CBC WITH DIFFERENTIAL/PLATELET
Basophils Absolute: 0 x10E3/uL (ref 0.0–0.2)
Basos: 1 %
EOS (ABSOLUTE): 0.4 x10E3/uL (ref 0.0–0.4)
Eos: 7 %
Hematocrit: 37.3 % — ABNORMAL LOW (ref 37.5–51.0)
Hemoglobin: 12.1 g/dL — ABNORMAL LOW (ref 13.0–17.7)
Immature Grans (Abs): 0 x10E3/uL (ref 0.0–0.1)
Immature Granulocytes: 0 %
Lymphocytes Absolute: 1.7 x10E3/uL (ref 0.7–3.1)
Lymphs: 30 %
MCH: 27.4 pg (ref 26.6–33.0)
MCHC: 32.4 g/dL (ref 31.5–35.7)
MCV: 85 fL (ref 79–97)
Monocytes Absolute: 0.4 x10E3/uL (ref 0.1–0.9)
Monocytes: 6 %
Neutrophils Absolute: 3.1 x10E3/uL (ref 1.4–7.0)
Neutrophils: 56 %
Platelets: 189 x10E3/uL (ref 150–450)
RBC: 4.41 x10E6/uL (ref 4.14–5.80)
RDW: 13.3 % (ref 11.6–15.4)
WBC: 5.6 x10E3/uL (ref 3.4–10.8)

## 2022-09-24 LAB — FERRITIN: Ferritin: 1156 ng/mL — ABNORMAL HIGH (ref 30–400)

## 2022-09-24 LAB — HEMOGLOBIN A1C
Est. average glucose Bld gHb Est-mCnc: 131 mg/dL
Hgb A1c MFr Bld: 6.2 % — ABNORMAL HIGH (ref 4.8–5.6)

## 2022-10-06 ENCOUNTER — Telehealth: Payer: Self-pay | Admitting: Family Medicine

## 2022-10-06 NOTE — Telephone Encounter (Signed)
Copied from [place] [id number]. Topic: General - Other >> Oct 06, 2022  1:16 PM Ja-Kwan M wrote: Reason for CRM: Amy with [facility] reports that patient is scheduled for an appt on 11/12/22 at 8:30 with Dr. Benay Pillow. Cb# [phone]

## 2023-01-24 ENCOUNTER — Ambulatory Visit: Payer: Medicare HMO | Admitting: Family Medicine

## 2023-03-07 ENCOUNTER — Other Ambulatory Visit: Payer: Self-pay | Admitting: Infectious Diseases

## 2023-03-07 DIAGNOSIS — R600 Localized edema: Secondary | ICD-10-CM

## 2023-03-08 ENCOUNTER — Other Ambulatory Visit: Payer: Self-pay | Admitting: Infectious Diseases

## 2023-03-08 DIAGNOSIS — R600 Localized edema: Secondary | ICD-10-CM

## 2023-03-11 ENCOUNTER — Ambulatory Visit: Payer: 59

## 2023-03-11 ENCOUNTER — Ambulatory Visit: Admission: RE | Admit: 2023-03-11 | Payer: 59 | Source: Ambulatory Visit

## 2023-03-28 ENCOUNTER — Ambulatory Visit
Admission: RE | Admit: 2023-03-28 | Discharge: 2023-03-28 | Disposition: A | Discharge status: Home / Self Care | Payer: 59 | Source: Ambulatory Visit | Attending: Infectious Diseases | Admitting: Infectious Diseases

## 2023-03-28 DIAGNOSIS — R600 Localized edema: Secondary | ICD-10-CM | POA: Diagnosis present

## 2023-08-08 ENCOUNTER — Inpatient Hospital Stay: Payer: 59

## 2023-08-08 ENCOUNTER — Other Ambulatory Visit: Payer: Self-pay

## 2023-08-08 ENCOUNTER — Emergency Department: Payer: 59

## 2023-08-08 ENCOUNTER — Inpatient Hospital Stay
Admission: EM | Admit: 2023-08-08 | Discharge: 2023-08-11 | DRG: 684 | Disposition: A | Discharge status: Home / Self Care | Payer: 59 | Attending: Internal Medicine | Admitting: Internal Medicine

## 2023-08-08 DIAGNOSIS — N400 Enlarged prostate without lower urinary tract symptoms: Secondary | ICD-10-CM | POA: Diagnosis present

## 2023-08-08 DIAGNOSIS — Z79899 Other long term (current) drug therapy: Secondary | ICD-10-CM

## 2023-08-08 DIAGNOSIS — E785 Hyperlipidemia, unspecified: Secondary | ICD-10-CM | POA: Diagnosis present

## 2023-08-08 DIAGNOSIS — D631 Anemia in chronic kidney disease: Secondary | ICD-10-CM | POA: Diagnosis present

## 2023-08-08 DIAGNOSIS — Z833 Family history of diabetes mellitus: Secondary | ICD-10-CM | POA: Diagnosis not present

## 2023-08-08 DIAGNOSIS — Z23 Encounter for immunization: Secondary | ICD-10-CM

## 2023-08-08 DIAGNOSIS — R202 Paresthesia of skin: Secondary | ICD-10-CM | POA: Diagnosis present

## 2023-08-08 DIAGNOSIS — N1832 Chronic kidney disease, stage 3b: Secondary | ICD-10-CM | POA: Diagnosis present

## 2023-08-08 DIAGNOSIS — Z7982 Long term (current) use of aspirin: Secondary | ICD-10-CM

## 2023-08-08 DIAGNOSIS — Z6831 Body mass index (BMI) 31.0-31.9, adult: Secondary | ICD-10-CM | POA: Diagnosis not present

## 2023-08-08 DIAGNOSIS — Z794 Long term (current) use of insulin: Secondary | ICD-10-CM | POA: Diagnosis not present

## 2023-08-08 DIAGNOSIS — F17210 Nicotine dependence, cigarettes, uncomplicated: Secondary | ICD-10-CM | POA: Diagnosis present

## 2023-08-08 DIAGNOSIS — E1122 Type 2 diabetes mellitus with diabetic chronic kidney disease: Secondary | ICD-10-CM | POA: Diagnosis present

## 2023-08-08 DIAGNOSIS — E669 Obesity, unspecified: Secondary | ICD-10-CM | POA: Diagnosis present

## 2023-08-08 DIAGNOSIS — N179 Acute kidney failure, unspecified: Secondary | ICD-10-CM | POA: Diagnosis not present

## 2023-08-08 DIAGNOSIS — Z7984 Long term (current) use of oral hypoglycemic drugs: Secondary | ICD-10-CM

## 2023-08-08 DIAGNOSIS — I129 Hypertensive chronic kidney disease with stage 1 through stage 4 chronic kidney disease, or unspecified chronic kidney disease: Secondary | ICD-10-CM | POA: Diagnosis present

## 2023-08-08 DIAGNOSIS — E875 Hyperkalemia: Secondary | ICD-10-CM | POA: Diagnosis present

## 2023-08-08 DIAGNOSIS — E119 Type 2 diabetes mellitus without complications: Secondary | ICD-10-CM | POA: Diagnosis present

## 2023-08-08 DIAGNOSIS — I10 Essential (primary) hypertension: Secondary | ICD-10-CM | POA: Diagnosis present

## 2023-08-08 DIAGNOSIS — Z720 Tobacco use: Secondary | ICD-10-CM | POA: Diagnosis present

## 2023-08-08 LAB — GLUCOSE, CAPILLARY
Glucose-Capillary: 118 mg/dL — ABNORMAL HIGH (ref 70–99)
Glucose-Capillary: 105 mg/dL — ABNORMAL HIGH (ref 70–99)

## 2023-08-08 LAB — COMPREHENSIVE METABOLIC PANEL
ALT: 18 U/L (ref 0–44)
AST: 18 U/L (ref 15–41)
Albumin: 4 g/dL (ref 3.5–5.0)
Alkaline Phosphatase: 63 U/L (ref 38–126)
Anion gap: 10 (ref 5–15)
BUN: 73 mg/dL — ABNORMAL HIGH (ref 8–23)
CO2: 15 mmol/L — ABNORMAL LOW (ref 22–32)
Calcium: 9.1 mg/dL (ref 8.9–10.3)
Chloride: 109 mmol/L (ref 98–111)
Creatinine, Ser: 4.14 mg/dL — ABNORMAL HIGH (ref 0.61–1.24)
GFR, Estimated: 15 mL/min — ABNORMAL LOW (ref >60)
Glucose, Bld: 119 mg/dL — ABNORMAL HIGH (ref 70–99)
Potassium: 5.3 mmol/L — ABNORMAL HIGH (ref 3.5–5.1)
Sodium: 134 mmol/L — ABNORMAL LOW (ref 135–145)
Total Bilirubin: 0.8 mg/dL (ref 0.3–1.2)
Total Protein: 8.1 g/dL (ref 6.5–8.1)

## 2023-08-08 LAB — URINALYSIS, ROUTINE W REFLEX MICROSCOPIC
Bilirubin Urine: NEGATIVE
Glucose, UA: 50 mg/dL — AB
Ketones, ur: NEGATIVE mg/dL
Nitrite: NEGATIVE
Protein, ur: NEGATIVE mg/dL
Specific Gravity, Urine: 1.011 (ref 1.005–1.030)
pH: 5 (ref 5.0–8.0)

## 2023-08-08 LAB — CBC
HCT: 34.5 % — ABNORMAL LOW (ref 39.0–52.0)
Hemoglobin: 11.2 g/dL — ABNORMAL LOW (ref 13.0–17.0)
MCH: 28.8 pg (ref 26.0–34.0)
MCHC: 32.5 g/dL (ref 30.0–36.0)
MCV: 88.7 fL (ref 80.0–100.0)
Platelets: 181 10*3/uL (ref 150–400)
RBC: 3.89 MIL/uL — ABNORMAL LOW (ref 4.22–5.81)
RDW: 14.6 % (ref 11.5–15.5)
WBC: 5.4 10*3/uL (ref 4.0–10.5)
nRBC: 0 % (ref 0.0–0.2)

## 2023-08-08 MED ORDER — ONDANSETRON HCL 4 MG PO TABS: 4.0000 mg | ORAL_TABLET | Freq: Four times a day (QID) | ORAL | Status: DC | PRN

## 2023-08-08 MED ORDER — NICOTINE 14 MG/24HR TD PT24: 14.0000 mg | MEDICATED_PATCH | Freq: Every day | TRANSDERMAL | Status: DC | PRN

## 2023-08-08 MED ORDER — ORAL CARE MOUTH RINSE: 15.0000 mL | OROMUCOSAL | Status: DC | PRN

## 2023-08-08 MED ORDER — ACETAMINOPHEN 325 MG PO TABS: 650.0000 mg | ORAL_TABLET | Freq: Four times a day (QID) | ORAL | Status: DC | PRN

## 2023-08-08 MED ORDER — SENNOSIDES-DOCUSATE SODIUM 8.6-50 MG PO TABS: 1.0000 | ORAL_TABLET | Freq: Every evening | ORAL | Status: DC | PRN

## 2023-08-08 MED ORDER — HYDRALAZINE HCL 20 MG/ML IJ SOLN: 5.0000 mg | Freq: Four times a day (QID) | INTRAMUSCULAR | Status: DC | PRN

## 2023-08-08 MED ORDER — DOCUSATE SODIUM 100 MG PO CAPS: 100.0000 mg | ORAL_CAPSULE | Freq: Two times a day (BID) | ORAL | Status: DC | PRN

## 2023-08-08 MED ORDER — INSULIN ASPART 100 UNIT/ML IJ SOLN: 0.0000 [IU] | Freq: Every day | INTRAMUSCULAR | Status: DC

## 2023-08-08 MED ORDER — ACETAMINOPHEN 650 MG RE SUPP: 650.0000 mg | Freq: Four times a day (QID) | RECTAL | Status: DC | PRN

## 2023-08-08 MED ORDER — NICOTINE POLACRILEX 2 MG MT GUM: 2.0000 mg | CHEWING_GUM | OROMUCOSAL | Status: DC | PRN

## 2023-08-08 MED ORDER — INSULIN ASPART 100 UNIT/ML IJ SOLN: 0.0000 [IU] | Freq: Three times a day (TID) | INTRAMUSCULAR | Status: DC

## 2023-08-08 MED ORDER — HEPARIN SODIUM (PORCINE) 5000 UNIT/ML IJ SOLN
5000.0000 [IU] | Freq: Three times a day (TID) | INTRAMUSCULAR | Status: DC
  Administered 2023-08-08 – 2023-08-11 (×8): 5000 [IU] via SUBCUTANEOUS
  Filled 2023-08-08 (×8): qty 1

## 2023-08-08 MED ORDER — ATORVASTATIN CALCIUM 10 MG PO TABS
10.0000 mg | ORAL_TABLET | Freq: Every day | ORAL | Status: DC
  Administered 2023-08-08 – 2023-08-10 (×3): 10 mg via ORAL
  Filled 2023-08-08 (×3): qty 1

## 2023-08-08 MED ORDER — SODIUM CHLORIDE 0.9 % IV SOLN: INTRAVENOUS | Status: AC

## 2023-08-08 MED ORDER — INFLUENZA VAC A&B SURF ANT ADJ 0.5 ML IM SUSY
0.5000 mL | PREFILLED_SYRINGE | INTRAMUSCULAR | Status: AC
  Administered 2023-08-09: 0.5 mL via INTRAMUSCULAR
  Filled 2023-08-08: qty 0.5

## 2023-08-08 MED ORDER — ONDANSETRON HCL 4 MG/2ML IJ SOLN: 4.0000 mg | Freq: Four times a day (QID) | INTRAMUSCULAR | Status: DC | PRN

## 2023-08-08 MED ORDER — SODIUM CHLORIDE 0.9 % IV BOLUS
1000.0000 mL | Freq: Once | INTRAVENOUS | Status: AC
  Administered 2023-08-08: 1000 mL via INTRAVENOUS

## 2023-08-08 NOTE — Plan of Care (Signed)
  Problem: Education: Goal: Ability to describe self-care measures that may prevent or decrease complications (Diabetes Survival Skills Education) will improve Outcome: Progressing   Problem: Coping: Goal: Ability to adjust to condition or change in health will improve Outcome: Progressing   

## 2023-08-08 NOTE — Hospital Course (Signed)
Mr. Demari Gales is a 67 year old male with history of hypertension, CKD 3B, history of tobacco use, non-insulin-dependent diabetes mellitus, who [facility] for chief concerns of acute kidney injury.  Vitals in the ED showed temperature of 98.4, respiration rate of 18, heart rate of 81, blood pressure 129/56, SpO2 100% on room air.  Serum sodium is 134, potassium 5.3, chloride 109, bicarb 15, BUN of 73, serum creatinine of 4.14, EGFR 15, nonfasting glucose 119, WBC 5.4, hemoglobin 11.2, platelets of 281.  UA has been ordered and pending collection.  EDP ordered a CT renal stone study: At the time of this dictation is pending a radiology read.  ED treatment: Sodium chloride 1 L bolus.

## 2023-08-08 NOTE — ED Notes (Signed)
Patient transported to CT 

## 2023-08-08 NOTE — ED Notes (Signed)
Pt transported up to 221A at this time.

## 2023-08-08 NOTE — ED Triage Notes (Signed)
Pt here with abnormal labs. Pt also c/o urinary hesitation at times. Pt denies burning with urination. Provider at [facility] is concerned about a urinary obstruction.

## 2023-08-08 NOTE — Assessment & Plan Note (Addendum)
Home lisinopril-hydrochlorothiazide 10-12.5 mg p.o. daily not resumed on admission due to AKI, a.m. team to resume when the benefits outweigh the risk Hydralazine 5 mg IV every 6 hours as needed for SBP greater than 165, 4 days ordered

## 2023-08-08 NOTE — ED Provider Notes (Signed)
[facility] Provider Note    Event Date/Time   First MD Initiated Contact with Patient 08/08/23 1401     (approximate)  History   Chief Complaint: Abnornal Labs  HPI  Austin Dudley is a 67 y.o. male with a past medical history of CKD who presents to the emergency department for worsening kidney function.  According to report patient was sent to the emergency department for abnormal labs, patient states he believes it is something to do with his kidney function but we have no further information.  Patient denies any painful urination denies any difficulty urination denies any urinary frequency.  Denies any lower abdominal discomfort.  Physical Exam   Triage Vital Signs: ED Triage Vitals [08/08/23 1236]  Encounter Vitals Group     BP (!) 129/56     Systolic BP Percentile      Diastolic BP Percentile      Pulse Rate 81     Resp 18     Temp 98.4 F (36.9 C)     Temp Source Oral     SpO2 100 %     Weight 278 lb (126.1 kg)     Height 6\' 7"  (2.007 m)     Head Circumference      Peak Flow      Pain Score 0     Pain Loc      Pain Education      Exclude from Growth Chart     Most recent vital signs: Vitals:   08/08/23 1236  BP: (!) 129/56  Pulse: 81  Resp: 18  Temp: 98.4 F (36.9 C)  SpO2: 100%    General: Awake, no distress.  CV:  Good peripheral perfusion.  Regular rate and rhythm  Resp:  Normal effort.  Equal breath sounds bilaterally.  Abd:  No distention.  Soft, nontender.  No rebound or guarding.  Benign abdomen.  ED Results / Procedures / Treatments   RADIOLOGY  I reviewed the CT images no obvious significant abnormality or signs of urinary retention on my evaluation.   MEDICATIONS ORDERED IN ED: Medications  sodium chloride 0.9 % bolus 1,000 mL (has no administration in time range)     IMPRESSION / MDM / ASSESSMENT AND PLAN / ED COURSE  I reviewed the triage vital signs and the nursing notes.  Patient's presentation is  most consistent with acute presentation with potential threat to life or bodily function.  Patient presents emergency department for abnormal lab work.  Lab work in the emergency department shows a reassuring CBC with a normal white blood cell count.  Patient's chemistry however does show creatinine elevation of 4.1 up from 1.37 a year ago.  Patient's potassium also elevated at 5.3.  There is no lower abdominal fullness or tenderness.  Will obtain a CT renal scan to further evaluate.  Patient just urinated so this will help [place] rule out any urinary retention or ureterolithiasis.  Will obtain a urine sample and send this for urinalysis.  Will begin IV hydration.  Given the patient's significant increase in creatinine over the past 1 year patient may require admission to the hospitalist service for further workup and treatment.  Patient is agreeable to this plan.  Patient's lab work shows increasing creatinine.  Urinalysis shows no concerning finding no obvious finding on CT imaging awaiting radiology read.  Will admit to the hospital service for further workup and treatment.  FINAL CLINICAL IMPRESSION(S) / ED DIAGNOSES   Acute renal failure  Note:  This document was prepared using Dragon voice recognition software and may include unintentional dictation errors.   Minna Antis, MD 08/08/23 1500

## 2023-08-08 NOTE — ED Notes (Signed)
..ED TO INPATIENT HANDOFF REPORT  ED Nurse Name and Phone #: Morrie Sheldon, RN [phone]  S Name/Age/Gender Austin Dudley 67 y.o. male Room/Bed: ED45A/ED45A  Code Status   Code Status: Full Code  Home/SNF/Other Home Patient oriented to: self, place, time, and situation Is this baseline? Yes   Triage Complete: Triage complete  Chief Complaint AKI (acute kidney injury) (HCC) [N17.9]  Triage Note Pt here with abnormal labs. Pt also c/o urinary hesitation at times. Pt denies burning with urination. Provider at [facility] is concerned about a urinary obstruction.   Allergies Allergies  Allergen Reactions   Amoxicillin Rash   Ampicillin Rash    Level of Care/Admitting Diagnosis ED Disposition     ED Disposition  Admit   Condition  --   Comment  Hospital Area: [facility] [[id number]]  Level of Care: Telemetry Medical [104]  Covid Evaluation: Asymptomatic - no recent exposure (last 10 days) testing not required  Diagnosis: AKI (acute kidney injury) [facility]) [[id number]]  Admitting Physician: Lovenia Kim [[id number]]  Attending Physician: [facility], AMY N [id number]  Certification:: I certify this patient will need inpatient services for at least 2 midnights  Expected Medical Readiness: 08/10/2023          B Medical/Surgery History Past Medical History:  Diagnosis Date   Chronic kidney disease    Vertigo    Past Surgical History:  Procedure Laterality Date   COLONOSCOPY WITH PROPOFOL N/A 09/03/2020   Procedure: COLONOSCOPY WITH PROPOFOL;  Surgeon: Pasty Spillers, MD;  Location: [facility] ENDOSCOPY;  Service: Endoscopy;  Laterality: N/A;   COLONOSCOPY WITH PROPOFOL N/A 04/14/2022   Procedure: COLONOSCOPY WITH PROPOFOL;  Surgeon: Toney Reil, MD;  Location: [facility] ENDOSCOPY;  Service: Gastroenterology;  Laterality: N/A;     A IV Location/Drains/Wounds Patient Lines/Drains/Airways Status     Active Line/Drains/Airways     Name Placement date  Placement time Site Days   Peripheral IV 08/08/23 20 G Right Antecubital 08/08/23  1440  Antecubital  less than 1            Intake/Output Last 24 hours No intake or output data in the 24 hours ending 08/08/23 1514  Labs/Imaging Results for orders placed or performed during the hospital encounter of 08/08/23 (from the past 48 hour(s))  CBC     Status: Abnormal   Collection Time: 08/08/23 12:40 PM  Result Value Ref Range   WBC 5.4 4.0 - 10.5 K/uL   RBC 3.89 (L) 4.22 - 5.81 MIL/uL   Hemoglobin 11.2 (L) 13.0 - 17.0 g/dL   HCT 11.9 (L) 14.7 - 82.9 %   MCV 88.7 80.0 - 100.0 fL   MCH 28.8 26.0 - 34.0 pg   MCHC 32.5 30.0 - 36.0 g/dL   RDW 56.2 13.0 - 86.5 %   Platelets 181 150 - 400 K/uL   nRBC 0.0 0.0 - 0.2 %    Comment: Performed at [facility], [address] [place], [place] [address]  Comprehensive metabolic panel     Status: Abnormal   Collection Time: 08/08/23 12:40 PM  Result Value Ref Range   Sodium 134 (L) 135 - 145 mmol/L   Potassium 5.3 (H) 3.5 - 5.1 mmol/L   Chloride 109 98 - 111 mmol/L   CO2 15 (L) 22 - 32 mmol/L   Glucose, Bld 119 (H) 70 - 99 mg/dL    Comment: Glucose reference range applies only to samples taken after fasting for at least 8 hours.  BUN 73 (H) 8 - 23 mg/dL   Creatinine, Ser 1.61 (H) 0.61 - 1.24 mg/dL   Calcium 9.1 8.9 - 09.6 mg/dL   Total Protein 8.1 6.5 - 8.1 g/dL   Albumin 4.0 3.5 - 5.0 g/dL   AST 18 15 - 41 U/L   ALT 18 0 - 44 U/L   Alkaline Phosphatase 63 38 - 126 U/L   Total Bilirubin 0.8 0.3 - 1.2 mg/dL   GFR, Estimated 15 (L) >60 mL/min    Comment: (NOTE) Calculated using the CKD-EPI Creatinine Equation (2021)    Anion gap 10 5 - 15    Comment: Performed at [facility], [address] [place], [place] [address]  Urinalysis, Routine w reflex microscopic -Urine, Clean Catch     Status: Abnormal   Collection Time: 08/08/23 12:40 PM  Result Value Ref Range   Color, Urine YELLOW (A) YELLOW   APPearance CLEAR  (A) CLEAR   Specific Gravity, Urine 1.011 1.005 - 1.030   pH 5.0 5.0 - 8.0   Glucose, UA 50 (A) NEGATIVE mg/dL   Hgb urine dipstick SMALL (A) NEGATIVE   Bilirubin Urine NEGATIVE NEGATIVE   Ketones, ur NEGATIVE NEGATIVE mg/dL   Protein, ur NEGATIVE NEGATIVE mg/dL   Nitrite NEGATIVE NEGATIVE   Leukocytes,Ua SMALL (A) NEGATIVE   RBC / HPF 0-5 0 - 5 RBC/hpf   WBC, UA 6-10 0 - 5 WBC/hpf   Bacteria, UA RARE (A) NONE SEEN   Squamous Epithelial / HPF 0-5 0 - 5 /HPF   Mucus PRESENT     Comment: Performed at [facility], [address] [place], [place] [address]   No results found.  Pending Labs Unresulted Labs (From admission, onward)     Start     Ordered   08/09/23 0500  Basic metabolic panel  Tomorrow morning,   STAT        08/08/23 1459   08/09/23 0500  CBC  Tomorrow morning,   STAT        08/08/23 1459   08/09/23 0500  Hemoglobin A1c  Tomorrow morning,   R       Comments: To assess prior glycemic control    08/08/23 1500            Vitals/Pain Today's Vitals   08/08/23 1236  BP: (!) 129/56  Pulse: 81  Resp: 18  Temp: 98.4 F (36.9 C)  TempSrc: Oral  SpO2: 100%  Weight: 126.1 kg  Height: 6\' 7"  (2.007 m)  PainSc: 0-No pain    Isolation Precautions No active isolations  Medications Medications  acetaminophen (TYLENOL) tablet 650 mg (has no administration in time range)    Or  acetaminophen (TYLENOL) suppository 650 mg (has no administration in time range)  ondansetron (ZOFRAN) tablet 4 mg (has no administration in time range)    Or  ondansetron (ZOFRAN) injection 4 mg (has no administration in time range)  heparin injection 5,000 Units (has no administration in time range)  senna-docusate (Senokot-S) tablet 1 tablet (has no administration in time range)  0.9 %  sodium chloride infusion (has no administration in time range)  insulin aspart (novoLOG) injection 0-9 Units (has no administration in time range)  insulin aspart (novoLOG) injection 0-5  Units (has no administration in time range)  nicotine (NICODERM CQ - dosed in mg/24 hours) patch 14 mg (has no administration in time range)    Or  nicotine polacrilex (NICORETTE) gum 2 mg (has no administration in time range)  hydrALAZINE (APRESOLINE) injection 5 mg (has no administration in time range)  sodium chloride 0.9 % bolus 1,000 mL (1,000 mLs Intravenous New Bag/Given 08/08/23 1441)    Mobility walks     Focused Assessments    R Recommendations: See Admitting Provider Note  Report given to:   Additional Notes: None

## 2023-08-08 NOTE — Assessment & Plan Note (Addendum)
I suspect this is prerenal secondary to poor water intake. Ultrasound renal has been ordered Nephrology has been consulted Status post sodium chloride 1 L bolus per EDP Will continue with sodium chloride infusion at 125 mL/h, 1 day ordered Recheck BMP in the a.m.

## 2023-08-08 NOTE — Assessment & Plan Note (Signed)
Metformin 500 mg p.o. twice daily not resumed on admission Insulin SSI with agents coverage ordered; goal inpatient blood glucose levels 140-180

## 2023-08-08 NOTE — Assessment & Plan Note (Signed)
-   Atorvastatin 10 mg nightly resumed 

## 2023-08-08 NOTE — Assessment & Plan Note (Addendum)
Presumed secondary to acute kidney injury EKG ordered Recheck BMP in a.m.

## 2023-08-08 NOTE — Assessment & Plan Note (Signed)
As needed nicotine patch/Nicorette gum

## 2023-08-08 NOTE — H&P (Signed)
History and Physical   Austin Dudley WUJ:[id number] DOB: 07-Mar-1956 DOA: 08/08/2023  [facility]P: [facility]  Outpatient Specialists: Dr. Allegra Lai, gastroenterology [facility]  I have personally briefly reviewed patient's old medical records in [facility] EMR.  Chief Concern: Abnormal lab  HPI: Austin Dudley is a 67 year old male with history of hypertension, CKD 3B, history of tobacco use, non-insulin-dependent diabetes mellitus, who [facility] for chief concerns of acute kidney injury.  Vitals in the ED showed temperature of 98.4, respiration rate of 18, heart rate of 81, blood pressure 129/56, SpO2 100% on room air.  Serum sodium is 134, potassium 5.3, chloride 109, bicarb 15, BUN of 73, serum creatinine of 4.14, EGFR 15, nonfasting glucose 119, WBC 5.4, hemoglobin 11.2, platelets of 281.  UA has been ordered and pending collection.  EDP ordered a CT renal stone study: At the time of this dictation is pending a radiology read.  ED treatment: Sodium chloride 1 L bolus. ----------------------------- At bedside, he is able to tell me his name, age, location, current calendar year. He denies urinary symptoms, dysuria, hematuria, diarrhea, blood in his stool, chest pain, shortness of breaht.  He reprots he drinks about 1 bottle of 20 oz per day. He denies swelling of his legs. He endorses tingling in bilateral legs.   He reports he knows he should drink more water. He reports he is not a 'water drinker'.   Social history: He lives at home on his own. He smokes 1-1.5 ppd. He has not smoked in 3 days. He denies etoh and recreational drug use. He is retired and formerly was a Administrator, [employer].   ROS: Constitutional: no weight change, no fever ENT/Mouth: no sore throat, no rhinorrhea Eyes: no eye pain, no vision changes Cardiovascular: no chest pain, no dyspnea,  no edema, no  palpitations Respiratory: no cough, no sputum, no wheezing Gastrointestinal: no nausea, no vomiting, no diarrhea, no constipation Genitourinary: no urinary incontinence, no dysuria, no hematuria Musculoskeletal: no arthralgias, no myalgias Skin: no skin lesions, no pruritus, Neuro: + weakness, no loss of consciousness, no syncope Psych: no anxiety, no depression, + decrease appetite Heme/Lymph: no bruising, no bleeding  ED Course: Discussed with emergency medicine provider, patient requiring hospitalization for chief concerns of acute kidney injury.  Assessment/Plan  Principal Problem:   AKI (acute kidney injury) (HCC) Active Problems:   Essential hypertension   Tobacco use   Type 2 diabetes mellitus without complications (HCC)   Hyperkalemia   Hyperlipidemia   Assessment and Plan:  * AKI (acute kidney injury) (HCC) I suspect this is prerenal secondary to poor water intake. Ultrasound renal has been ordered Nephrology has been consulted Status post sodium chloride 1 L bolus per EDP Will continue with sodium chloride infusion at 125 mL/h, 1 day ordered Recheck BMP in the a.m.  Hyperlipidemia Atorvastatin 10 mg nightly resumed  Hyperkalemia Presumed secondary to acute kidney injury EKG ordered Recheck BMP in a.m.  Type 2 diabetes mellitus without complications (HCC) Metformin 500 mg p.o. twice daily not resumed on admission Insulin SSI with agents coverage ordered; goal inpatient blood glucose levels 140-180  Tobacco use As needed nicotine patch/Nicorette gum  Essential hypertension Home lisinopril-hydrochlorothiazide 10-12.5 mg p.o. daily not resumed on admission due to AKI, a.m. team to resume when the benefits outweigh the risk Hydralazine 5 mg IV every 6 hours as needed for SBP greater than 165, 4 days ordered  Chart reviewed.  DVT prophylaxis: Heparin 5000 units subcutaneous every 8 hours Code Status: Full code Diet: Renal/carb modified Family  Communication: Patient requested to update his sister.  I attempted to call sister, Brayton Mars, [phone].  Phone number is no longer active. Disposition Plan: Pending clinical course Consults called: Nephrology service Admission status: Telemetry medical, inpatient  Past Medical History:  Diagnosis Date   Chronic kidney disease    Vertigo    Past Surgical History:  Procedure Laterality Date   COLONOSCOPY WITH PROPOFOL N/A 09/03/2020   Procedure: COLONOSCOPY WITH PROPOFOL;  Surgeon: Pasty Spillers, MD;  Location: [facility] ENDOSCOPY;  Service: Endoscopy;  Laterality: N/A;   COLONOSCOPY WITH PROPOFOL N/A 04/14/2022   Procedure: COLONOSCOPY WITH PROPOFOL;  Surgeon: Toney Reil, MD;  Location: [facility] ENDOSCOPY;  Service: Gastroenterology;  Laterality: N/A;   Social History:  reports that he has been smoking cigarettes. He started smoking about 50 years ago. He has a 11.8 pack-year smoking history. He has never used smokeless tobacco. He reports that he does not currently use alcohol. He reports that he does not currently use drugs.  Allergies  Allergen Reactions   Amoxicillin Rash   Ampicillin Rash   Family History  Problem Relation Age of Onset   Stroke Father    Diabetes Sister    Family history: Family history reviewed and not pertinent.  Prior to Admission medications   Medication Sig Start Date End Date Taking? Authorizing Provider  allopurinol (ZYLOPRIM) 100 MG tablet Take 50 mg by mouth daily. 09/01/21   [provider]  aspirin EC 81 MG tablet Take 81 mg by mouth daily. Swallow whole.    [provider]  atorvastatin (LIPITOR) 10 MG tablet Take 1 tablet by mouth once daily 06/18/22   Duanne Limerick, MD  docusate sodium (COLACE) 50 MG capsule Take 1 capsule (50 mg total) by mouth 2 (two) times daily. 09/23/22   Duanne Limerick, MD  ferrous sulfate 325 (65 FE) MG EC tablet Take 1 tablet (325 mg total) by mouth daily with breakfast. Patient not  taking: Reported on 09/10/2022 03/25/22   Duanne Limerick, MD  lisinopril-hydrochlorothiazide (ZESTORETIC) 10-12.5 MG tablet Take 1 tablet by mouth daily. 09/10/22   Duanne Limerick, MD  metFORMIN (GLUCOPHAGE) 500 MG tablet Take 1 tablet (500 mg total) by mouth 2 (two) times daily with a meal. 09/23/22   Duanne Limerick, MD   Physical Exam: Vitals:   08/08/23 1236  BP: (!) 129/56  Pulse: 81  Resp: 18  Temp: 98.4 F (36.9 C)  TempSrc: Oral  SpO2: 100%  Weight: 126.1 kg  Height: 6\' 7"  (2.007 m)   Constitutional: appears older than chronological age, NAD, calm Eyes: PERRL, lids and conjunctivae normal ENMT: Mucous membranes are moist. Posterior pharynx clear of any exudate or lesions. Age-appropriate dentition. Hearing appropriate Neck: normal, supple, no masses, no thyromegaly Respiratory: clear to auscultation bilaterally, no wheezing, no crackles. Normal respiratory effort. No accessory muscle use.  Cardiovascular: Regular rate and rhythm, no murmurs / rubs / gallops. No extremity edema. 2+ pedal pulses. No carotid bruits.  Abdomen: no tenderness, no masses palpated, no hepatosplenomegaly. Bowel sounds positive.  Musculoskeletal: no clubbing / cyanosis. No joint deformity upper and lower extremities. Good ROM, no contractures, no atrophy. Normal muscle tone.  Skin: no rashes, lesions, ulcers. No induration Neurologic: Sensation intact. Strength 5/5 in all 4.  Psychiatric: Normal judgment and insight. Alert and oriented x 3. Normal mood.   EKG: Ordered pending completion  at the time of this dictation  Chest x-ray on Admission: Not indicated at this time  Labs on Admission: I have personally reviewed following labs  CBC: Recent Labs  Lab 08/08/23 1240  WBC 5.4  HGB 11.2*  HCT 34.5*  MCV 88.7  PLT 181   Basic Metabolic Panel: Recent Labs  Lab 08/08/23 1240  NA 134*  K 5.3*  CL 109  CO2 15*  GLUCOSE 119*  BUN 73*  CREATININE 4.14*  CALCIUM 9.1   GFR: Estimated  Creatinine Clearance: 25.8 mL/min (A) (by C-G formula based on SCr of 4.14 mg/dL (H)).  Liver Function Tests: Recent Labs  Lab 08/08/23 1240  AST 18  ALT 18  ALKPHOS 63  BILITOT 0.8  PROT 8.1  ALBUMIN 4.0   Urine analysis:    Component Value Date/Time   COLORURINE YELLOW (A) 08/08/2023 1240   APPEARANCEUR CLEAR (A) 08/08/2023 1240   LABSPEC 1.011 08/08/2023 1240   PHURINE 5.0 08/08/2023 1240   GLUCOSEU 50 (A) 08/08/2023 1240   HGBUR SMALL (A) 08/08/2023 1240   BILIRUBINUR NEGATIVE 08/08/2023 1240   KETONESUR NEGATIVE 08/08/2023 1240   PROTEINUR NEGATIVE 08/08/2023 1240   NITRITE NEGATIVE 08/08/2023 1240   LEUKOCYTESUR SMALL (A) 08/08/2023 1240   This document was prepared using Dragon Voice Recognition software and may include unintentional dictation errors.  Dr. Sedalia Muta [facility]  If 7PM-7AM, please contact overnight-coverage provider If 7AM-7PM, please contact day attending provider [email or web address]  08/08/2023, 3:55 PM

## 2023-08-09 DIAGNOSIS — N179 Acute kidney failure, unspecified: Secondary | ICD-10-CM | POA: Diagnosis not present

## 2023-08-09 LAB — GLUCOSE, CAPILLARY
Glucose-Capillary: 104 mg/dL — ABNORMAL HIGH (ref 70–99)
Glucose-Capillary: 120 mg/dL — ABNORMAL HIGH (ref 70–99)
Glucose-Capillary: 110 mg/dL — ABNORMAL HIGH (ref 70–99)

## 2023-08-09 LAB — CBC
HCT: 32.8 % — ABNORMAL LOW (ref 39.0–52.0)
Hemoglobin: 10.5 g/dL — ABNORMAL LOW (ref 13.0–17.0)
MCH: 29 pg (ref 26.0–34.0)
MCHC: 32 g/dL (ref 30.0–36.0)
MCV: 90.6 fL (ref 80.0–100.0)
Platelets: 181 10*3/uL (ref 150–400)
RBC: 3.62 MIL/uL — ABNORMAL LOW (ref 4.22–5.81)
RDW: 14.4 % (ref 11.5–15.5)
WBC: 5.5 10*3/uL (ref 4.0–10.5)
nRBC: 0 % (ref 0.0–0.2)

## 2023-08-09 LAB — BASIC METABOLIC PANEL
Anion gap: 7 (ref 5–15)
BUN: 60 mg/dL — ABNORMAL HIGH (ref 8–23)
CO2: 16 mmol/L — ABNORMAL LOW (ref 22–32)
Calcium: 8.7 mg/dL — ABNORMAL LOW (ref 8.9–10.3)
Chloride: 113 mmol/L — ABNORMAL HIGH (ref 98–111)
Creatinine, Ser: 3.07 mg/dL — ABNORMAL HIGH (ref 0.61–1.24)
GFR, Estimated: 21 mL/min — ABNORMAL LOW (ref >60)
Glucose, Bld: 104 mg/dL — ABNORMAL HIGH (ref 70–99)
Potassium: 5.5 mmol/L — ABNORMAL HIGH (ref 3.5–5.1)
Sodium: 136 mmol/L (ref 135–145)

## 2023-08-09 LAB — HEMOGLOBIN A1C
Hgb A1c MFr Bld: 5.9 % — ABNORMAL HIGH (ref 4.8–5.6)
Mean Plasma Glucose: 122.63 mg/dL

## 2023-08-09 MED ORDER — LATANOPROST 0.005 % OP SOLN
1.0000 [drp] | Freq: Every day | OPHTHALMIC | Status: DC
  Administered 2023-08-09 – 2023-08-10 (×2): 1 [drp] via OPHTHALMIC
  Filled 2023-08-09: qty 2.5

## 2023-08-09 NOTE — Plan of Care (Signed)
Admit 9/30 pt progressing with plan of care.

## 2023-08-09 NOTE — Progress Notes (Signed)
[facility]  ROUNDING NOTE   Subjective:   Austin Dudley is a 67 year old male with past medical conditions including diabetes, tobacco use, hypertension, and chronic kidney disease stage IIIa.  Patient presents to the emergency department with abnormal labs and has been diagnosed with AKI (acute kidney injury) (HCC) [N17.9] Acute renal failure, unspecified acute renal failure type [facility]) [N17.9]  Patient is known to our practice but was lost to follow-up.  Patient was last seen in office by Dr. Cherylann Ratel on 07/09/2021.  Patient is seen sitting at bedside, no family present.  Patient states he continues to live at home.  Denies recent nausea, vomiting, or diarrhea.  States he eats pretty well.  Denies known fever or chills.  Labs on ED arrival significant for sodium 134, potassium 5.3, serum bicarb 15, BUN 73, creatinine 4.14 with GFR 15, and hemoglobin 11.2.  CT renal stone negative for hydronephrosis.  Renal ultrasound shows enlarged prostate.  We have been consulted to evaluate acute kidney injury.   Objective:  Vital signs in last 24 hours:  Temp:  [97.7 F (36.5 C)-98.4 F (36.9 C)] 97.7 F (36.5 C) (10/01 0843) Pulse Rate:  [69-90] 90 (10/01 0843) Resp:  [18-25] 20 (10/01 0843) BP: (96-125)/(34-104) 125/104 (10/01 0843) SpO2:  [93 %-100 %] 97 % (10/01 0843)  Weight change:  Filed Weights   08/08/23 1236  Weight: 126.1 kg    Intake/Output: I/O last 3 completed shifts: In: 616.5 [P.O.:480; I.V.:86.5; IV Piggyback:50] Out: 1950 [Urine:1950]   Intake/Output this shift:  No intake/output data recorded.  Physical Exam: General: NAD, sitting at bedside  Head: Normocephalic, atraumatic. Moist oral mucosal membranes  Eyes: Anicteric  Lungs:  Clear to auscultation, normal effort  Heart: Regular rate and rhythm  Abdomen:  Soft, nontender,   Extremities:  No peripheral edema.  Neurologic: Nonfocal, moving all four extremities  Skin: No lesions  Access: None     Basic Metabolic Panel: Recent Labs  Lab 08/08/23 1240 08/09/23 0704  NA 134* 136  K 5.3* 5.5*  CL 109 113*  CO2 15* 16*  GLUCOSE 119* 104*  BUN 73* 60*  CREATININE 4.14* 3.07*  CALCIUM 9.1 8.7*    Liver Function Tests: Recent Labs  Lab 08/08/23 1240  AST 18  ALT 18  ALKPHOS 63  BILITOT 0.8  PROT 8.1  ALBUMIN 4.0   No results for input(s): "LIPASE", "AMYLASE" in the last 168 hours. No results for input(s): "AMMONIA" in the last 168 hours.  CBC: Recent Labs  Lab 08/08/23 1240 08/09/23 0704  WBC 5.4 5.5  HGB 11.2* 10.5*  HCT 34.5* 32.8*  MCV 88.7 90.6  PLT 181 181    Cardiac Enzymes: No results for input(s): "CKTOTAL", "CKMB", "CKMBINDEX", "TROPONINI" in the last 168 hours.  BNP: Invalid input(s): "POCBNP"  CBG: Recent Labs  Lab 08/08/23 1632 08/08/23 2141 08/09/23 0828 08/09/23 1231  GLUCAP 118* 105* 104* 120*    Microbiology: Results for orders placed or performed in visit on 04/20/22  Fecal occult blood, imunochemical([facility])     Status: None   Collection Time: 04/29/22 12:00 AM  Result Value Ref Range Status   Fecal Occult Bld Negative Negative Final    Coagulation Studies: No results for input(s): "LABPROT", "INR" in the last 72 hours.  Urinalysis: Recent Labs    08/08/23 1240  COLORURINE YELLOW*  LABSPEC 1.011  PHURINE 5.0  GLUCOSEU 50*  HGBUR SMALL*  BILIRUBINUR NEGATIVE  KETONESUR NEGATIVE  PROTEINUR NEGATIVE  NITRITE NEGATIVE  LEUKOCYTESUR SMALL*  Imaging: US RENAL  Result Date: 08/08/2023 CLINICAL DATA:  Acute kidney injury. EXAM: RENAL / URINARY TRACT ULTRASOUND COMPLETE COMPARISON:  08/19/2020.  Abdomen and pelvis CT dated 08/08/2023. FINDINGS: Right Kidney: Renal measurements: 9.4 x 5.2 x 5.0 cm = volume: 129 mL. Echogenicity within normal limits. No mass or hydronephrosis visualized. Left Kidney: Renal measurements: 9.8 x 5.5 x 5.1 cm = volume: 144 mL. Echogenicity within normal limits. 2.4 cm  simple cyst. This does not need imaging follow-up. No mass or hydronephrosis visualized. Bladder: Appears normal for degree of bladder distention. Other: Moderately enlarged prostate gland protruding into the base of the urinary bladder. IMPRESSION: 1. No acute abnormality. 2. Moderately enlarged prostate gland protruding into the base of the urinary bladder. Electronically Signed   By: Beckie Salts M.D.   On: 08/08/2023 18:14   CT Renal Stone Study  Result Date: 08/08/2023 CLINICAL DATA:  Abdominal and flank pain.  Dysuria. EXAM: CT ABDOMEN AND PELVIS WITHOUT CONTRAST TECHNIQUE: Multidetector CT imaging of the abdomen and pelvis was performed following the standard protocol without IV contrast. RADIATION DOSE REDUCTION: This exam was performed according to the departmental dose-optimization program which includes automated exposure control, adjustment of the mA and/or kV according to patient size and/or use of iterative reconstruction technique. COMPARISON:  None Available. FINDINGS: Lower chest: No acute findings. Hepatobiliary: No mass visualized on this unenhanced exam. Gallbladder is unremarkable. No evidence of biliary ductal dilatation. Pancreas: No mass or inflammatory process visualized on this unenhanced exam. Spleen:  Within normal limits in size. Adrenals/Urinary tract: No evidence of urolithiasis or hydronephrosis. Unremarkable unopacified urinary bladder. Stomach/Bowel: No evidence of obstruction, inflammatory process, or abnormal fluid collections. Normal appendix visualized. Vascular/Lymphatic: No pathologically enlarged lymph nodes identified. No evidence of abdominal aortic aneurysm. Reproductive:  Mildly enlarged prostate. Other:  None. Musculoskeletal:  No suspicious bone lesions identified. IMPRESSION: No evidence of urolithiasis, hydronephrosis, or other acute findings. Mildly enlarged prostate. Electronically Signed   By: Danae Orleans M.D.   On: 08/08/2023 17:01     Medications:     sodium chloride 125 mL/hr at 08/09/23 0039    atorvastatin  10 mg Oral QHS   heparin  5,000 Units Subcutaneous Q8H   insulin aspart  0-5 Units Subcutaneous QHS   insulin aspart  0-9 Units Subcutaneous TID WC   latanoprost  1 drop Both Eyes QHS   acetaminophen **OR** acetaminophen, docusate sodium, hydrALAZINE, nicotine **OR** nicotine polacrilex, ondansetron **OR** ondansetron (ZOFRAN) IV, mouth rinse, senna-docusate  Assessment/ Plan:  Austin Dudley is a 67 y.o.  male with past medical conditions including diabetes, tobacco use, hypertension, and chronic kidney disease stage IIIa.  Patient presents to the emergency department with abnormal labs and has been diagnosed with AKI (acute kidney injury) (HCC) [N17.9] Acute renal failure, unspecified acute renal failure type (HCC) [N17.9]   Acute Kidney Injury on chronic kidney disease stage IIIa with baseline creatinine 1.37 on 03/23/22.  Acute kidney injury suspected secondary to poor oral intake. Chronic kidney disease is secondary to diabetes and hypertension.  Renal ultrasound negative for obstruction, enlarged prostate noted.  CT renal stone negative for obstruction.  Appears to be responding well to IV fluids.  Agree to continue this and we will monitor closely.  No acute indication for dialysis at this time.  At discharge, will reestablish patient with our office.  Lab Results  Component Value Date   CREATININE 3.07 (H) 08/09/2023   CREATININE 4.14 (H) 08/08/2023   CREATININE 1.37 (H) 03/23/2022  Intake/Output Summary (Last 24 hours) at 08/09/2023 1335 Last data filed at 08/09/2023 0539 Gross per 24 hour  Intake 616.51 ml  Output 1950 ml  Net -1333.49 ml   2.  Hypertension with chronic kidney disease.  Home regimen includes lisinopril-hydrochlorothiazide.  Currently held at this time  3. Anemia of chronic kidney disease Lab Results  Component Value Date   HGB 10.5 (L) 08/09/2023    Hemoglobin within optimal range.  No  need for ESA's at this time.  Will monitor.  4. Diabetes mellitus type II with chronic kidney disease/renal manifestations: noninsulin dependent. Home regimen includes Jardiance. Most recent hemoglobin A1c is 5.9 on 08/09/23.     LOS: 1 Wm Sahagun 10/1/20241:35 PM

## 2023-08-09 NOTE — Progress Notes (Signed)
Transition of Care [facility]) - Inpatient Brief Assessment   Patient Details  Name: Austin Dudley MRN: [id number] Date of Birth: 1956/05/29  [facility] CM/SW Contact:    Darolyn Rua, LCSW Phone Number: 08/09/2023, 9:29 AM   Clinical Narrative:  Patient presents [facility] ED from [facility] for abnormal labs, continued medical workup.  Insurance: [employer]  No TOC needs identified at this time please consult TOC should needs arise.   Transition of Care Asessment: Insurance and Status: Insurance coverage has been reviewed Patient has primary care physician: Yes Home environment has been reviewed: from home Prior level of function:: independent Prior/Current Home Services: No current home services Social Determinants of Health Reivew: SDOH reviewed no interventions necessary Readmission risk has been reviewed: Yes Transition of care needs: no transition of care needs at this time

## 2023-08-09 NOTE — Progress Notes (Addendum)
Progress Note    Austin Dudley  TFT:[id number] DOB: 1956-05-25  DOA: 08/08/2023 PCP: [facility]      Brief Narrative:    Medical records reviewed and are as summarized below:  Austin Dudley is a 67 y.o. male with medical history significant for hypertension, type II DM, CKD stage IIIa, history of tobacco use disorder, who presented to the hospital because of abnormal labs and concern for urinary obstruction.  He was found to have acute kidney injury with hyperkalemia.   Assessment/Plan:   Principal Problem:   AKI (acute kidney injury) (HCC) Active Problems:   Essential hypertension   Tobacco use   Type 2 diabetes mellitus without complications (HCC)   Hyperkalemia   Hyperlipidemia    Body mass index is 31.32 kg/m.  (Obesity)   Acute kidney injury on CKD stage IIIa, hyperkalemia: Continue IV fluids for hydration.  Monitor BMP.  Baseline creatinine around 1.37 per nephrologist.  No indication for dialysis at this time. No evidence of hydronephrosis or obstruction on renal ultrasound but he has enlarged prostate.   Hypertension: Lisinopril-HCTZ has been held   Type II DM: Hemoglobin A1c is 5.9.  Glucose level are optimal.  Discontinue sliding scale NovoLog for now.     Hyperlipidemia: Continue atorvastatin  Diet Order             Diet renal/carb modified with fluid restriction Diet-HS Snack? Nothing; Fluid restriction: 1200 mL Fluid; Room service appropriate? Yes; Fluid consistency: Thin  Diet effective now                            Consultants: Nephrologist  Procedures: None    Medications:    atorvastatin  10 mg Oral QHS   heparin  5,000 Units Subcutaneous Q8H   insulin aspart  0-5 Units Subcutaneous QHS   insulin aspart  0-9 Units Subcutaneous TID WC   latanoprost  1 drop Both Eyes QHS   Continuous Infusions:  sodium chloride 125 mL/hr at 08/09/23 0039     Anti-infectives (From admission, onward)     None              Family Communication/Anticipated D/C date and plan/Code Status   DVT prophylaxis: heparin injection 5,000 Units Start: 08/08/23 2200 Place TED hose Start: 08/08/23 1458     Code Status: Full Code  Family Communication: None Disposition Plan: Plan to discharge home   Status is: Inpatient Remains inpatient appropriate because: AKI       Subjective:   Interval events noted.  He complains of decreased urine volume.  No vomiting, diarrhea or abdominal pain.  Objective:    Vitals:   08/08/23 2234 08/09/23 0231 08/09/23 0234 08/09/23 0843  BP:  (!) 96/34 (!) 96/56 (!) 125/104  Pulse:  79 82 90  Resp: (!) 25 18  20   Temp:  98.3 F (36.8 C)  97.7 F (36.5 C)  TempSrc:  Oral  Oral  SpO2:  98%  97%  Weight:      Height:       No data found.   Intake/Output Summary (Last 24 hours) at 08/09/2023 1457 Last data filed at 08/09/2023 1354 Gross per 24 hour  Intake 616.51 ml  Output 3400 ml  Net -2783.49 ml   Filed Weights   08/08/23 1236  Weight: 126.1 kg    Exam:  GEN: NAD SKIN: Warm and dry EYES: No pallor or icterus ENT: MMM  CV: RRR PULM: CTA B ABD: soft, ND, NT, +BS CNS: AAO x 3, non focal EXT: No edema or tenderness        Data Reviewed:   I have personally reviewed following labs and imaging studies:  Labs: Labs show the following:   Basic Metabolic Panel: Recent Labs  Lab Aug 26, 2023 1240 08/09/23 0704  NA 134* 136  K 5.3* 5.5*  CL 109 113*  CO2 15* 16*  GLUCOSE 119* 104*  BUN 73* 60*  CREATININE 4.14* 3.07*  CALCIUM 9.1 8.7*   GFR Estimated Creatinine Clearance: 34.8 mL/min (A) (by C-G formula based on SCr of 3.07 mg/dL (H)). Liver Function Tests: Recent Labs  Lab 08/26/23 1240  AST 18  ALT 18  ALKPHOS 63  BILITOT 0.8  PROT 8.1  ALBUMIN 4.0   No results for input(s): "LIPASE", "AMYLASE" in the last 168 hours. No results for input(s): "AMMONIA" in the last 168 hours. Coagulation profile No  results for input(s): "INR", "PROTIME" in the last 168 hours.  CBC: Recent Labs  Lab Aug 26, 2023 1240 08/09/23 0704  WBC 5.4 5.5  HGB 11.2* 10.5*  HCT 34.5* 32.8*  MCV 88.7 90.6  PLT 181 181   Cardiac Enzymes: No results for input(s): "CKTOTAL", "CKMB", "CKMBINDEX", "TROPONINI" in the last 168 hours. BNP (last 3 results) No results for input(s): "PROBNP" in the last 8760 hours. CBG: Recent Labs  Lab 08/26/23 1632 2023/08/26 2141 08/09/23 0828 08/09/23 1231  GLUCAP 118* 105* 104* 120*   D-Dimer: No results for input(s): "DDIMER" in the last 72 hours. Hgb A1c: Recent Labs    08/09/23 0704  HGBA1C 5.9*   Lipid Profile: No results for input(s): "CHOL", "HDL", "LDLCALC", "TRIG", "CHOLHDL", "LDLDIRECT" in the last 72 hours. Thyroid function studies: No results for input(s): "TSH", "T4TOTAL", "T3FREE", "THYROIDAB" in the last 72 hours.  Invalid input(s): "FREET3" Anemia work up: No results for input(s): "VITAMINB12", "FOLATE", "FERRITIN", "TIBC", "IRON", "RETICCTPCT" in the last 72 hours. Sepsis Labs: Recent Labs  Lab 2023-08-26 1240 08/09/23 0704  WBC 5.4 5.5    Microbiology No results found for this or any previous visit (from the past 240 hour(s)).  Procedures and diagnostic studies:  US RENAL  Result Date: 08-26-23 CLINICAL DATA:  Acute kidney injury. EXAM: RENAL / URINARY TRACT ULTRASOUND COMPLETE COMPARISON:  08/19/2020.  Abdomen and pelvis CT dated August 26, 2023. FINDINGS: Right Kidney: Renal measurements: 9.4 x 5.2 x 5.0 cm = volume: 129 mL. Echogenicity within normal limits. No mass or hydronephrosis visualized. Left Kidney: Renal measurements: 9.8 x 5.5 x 5.1 cm = volume: 144 mL. Echogenicity within normal limits. 2.4 cm simple cyst. This does not need imaging follow-up. No mass or hydronephrosis visualized. Bladder: Appears normal for degree of bladder distention. Other: Moderately enlarged prostate gland protruding into the base of the urinary bladder.  IMPRESSION: 1. No acute abnormality. 2. Moderately enlarged prostate gland protruding into the base of the urinary bladder. Electronically Signed   By: Beckie Salts M.D.   On: 2023/08/26 18:14   CT Renal Stone Study  Result Date: 08/26/2023 CLINICAL DATA:  Abdominal and flank pain.  Dysuria. EXAM: CT ABDOMEN AND PELVIS WITHOUT CONTRAST TECHNIQUE: Multidetector CT imaging of the abdomen and pelvis was performed following the standard protocol without IV contrast. RADIATION DOSE REDUCTION: This exam was performed according to the departmental dose-optimization program which includes automated exposure control, adjustment of the mA and/or kV according to patient size and/or use of iterative reconstruction technique. COMPARISON:  None Available. FINDINGS: Lower chest:  No acute findings. Hepatobiliary: No mass visualized on this unenhanced exam. Gallbladder is unremarkable. No evidence of biliary ductal dilatation. Pancreas: No mass or inflammatory process visualized on this unenhanced exam. Spleen:  Within normal limits in size. Adrenals/Urinary tract: No evidence of urolithiasis or hydronephrosis. Unremarkable unopacified urinary bladder. Stomach/Bowel: No evidence of obstruction, inflammatory process, or abnormal fluid collections. Normal appendix visualized. Vascular/Lymphatic: No pathologically enlarged lymph nodes identified. No evidence of abdominal aortic aneurysm. Reproductive:  Mildly enlarged prostate. Other:  None. Musculoskeletal:  No suspicious bone lesions identified. IMPRESSION: No evidence of urolithiasis, hydronephrosis, or other acute findings. Mildly enlarged prostate. Electronically Signed   By: Danae Orleans M.D.   On: 08/08/2023 17:01               LOS: 1 day   Austin Dudley  [facility]   Pager on [email or web address]. If 7PM-7AM, please contact night-coverage at [email or web address]     08/09/2023, 2:57 PM

## 2023-08-10 DIAGNOSIS — N179 Acute kidney failure, unspecified: Secondary | ICD-10-CM | POA: Diagnosis not present

## 2023-08-10 LAB — BASIC METABOLIC PANEL
Anion gap: 5 (ref 5–15)
BUN: 52 mg/dL — ABNORMAL HIGH (ref 8–23)
CO2: 17 mmol/L — ABNORMAL LOW (ref 22–32)
Calcium: 8.9 mg/dL (ref 8.9–10.3)
Chloride: 114 mmol/L — ABNORMAL HIGH (ref 98–111)
Creatinine, Ser: 2.48 mg/dL — ABNORMAL HIGH (ref 0.61–1.24)
GFR, Estimated: 28 mL/min — ABNORMAL LOW (ref >60)
Glucose, Bld: 106 mg/dL — ABNORMAL HIGH (ref 70–99)
Potassium: 5.6 mmol/L — ABNORMAL HIGH (ref 3.5–5.1)
Sodium: 136 mmol/L (ref 135–145)

## 2023-08-10 LAB — GLUCOSE, CAPILLARY: Glucose-Capillary: 102 mg/dL — ABNORMAL HIGH (ref 70–99)

## 2023-08-10 MED ORDER — SODIUM CHLORIDE 0.9 % IV SOLN: INTRAVENOUS | Status: DC

## 2023-08-10 MED ORDER — TAMSULOSIN HCL 0.4 MG PO CAPS
0.4000 mg | ORAL_CAPSULE | Freq: Every day | ORAL | Status: DC
  Administered 2023-08-10 – 2023-08-11 (×2): 0.4 mg via ORAL
  Filled 2023-08-10 (×2): qty 1

## 2023-08-10 MED ORDER — ALLOPURINOL 100 MG PO TABS
100.0000 mg | ORAL_TABLET | Freq: Every day | ORAL | Status: DC
  Administered 2023-08-10 – 2023-08-11 (×2): 100 mg via ORAL
  Filled 2023-08-10 (×2): qty 1

## 2023-08-10 MED ORDER — INSULIN ASPART 100 UNIT/ML IV SOLN
10.0000 [IU] | Freq: Once | INTRAVENOUS | Status: AC
  Administered 2023-08-10: 10 [IU] via INTRAVENOUS
  Filled 2023-08-10: qty 0.1

## 2023-08-10 MED ORDER — DEXTROSE 50 % IV SOLN
1.0000 | Freq: Once | INTRAVENOUS | Status: AC
  Administered 2023-08-10: 50 mL via INTRAVENOUS
  Filled 2023-08-10: qty 50

## 2023-08-10 NOTE — Plan of Care (Signed)
  Problem: Coping: Goal: Ability to adjust to condition or change in health will improve Outcome: Progressing   Problem: Coping: Goal: Ability to adjust to condition or change in health will improve Outcome: Progressing   Problem: Fluid Volume: Goal: Ability to maintain a balanced intake and output will improve Outcome: Progressing   Problem: Health Behavior/Discharge Planning: Goal: Ability to identify and utilize available resources and services will improve Outcome: Progressing   Problem: Health Behavior/Discharge Planning: Goal: Ability to manage health-related needs will improve Outcome: Progressing   Problem: Education: Goal: Knowledge of General Education information will improve Description: Including pain rating scale, medication(s)/side effects and non-pharmacologic comfort measures Outcome: Progressing   Problem: Health Behavior/Discharge Planning: Goal: Ability to manage health-related needs will improve Outcome: Progressing   Problem: Clinical Measurements: Goal: Ability to maintain clinical measurements within normal limits will improve Outcome: Progressing   Problem: Clinical Measurements: Goal: Diagnostic test results will improve Outcome: Progressing   Problem: Clinical Measurements: Goal: Respiratory complications will improve Outcome: Progressing

## 2023-08-10 NOTE — Progress Notes (Signed)
[facility]  ROUNDING NOTE   Subjective:   Austin Dudley is a 67 year old male with past medical conditions including diabetes, tobacco use, hypertension, and chronic kidney disease stage IIIa.  Patient presents to the emergency department with abnormal labs and has been diagnosed with AKI (acute kidney injury) (HCC) [N17.9] Acute renal failure, unspecified acute renal failure type [facility]) [N17.9]  Patient is known to our practice but was lost to follow-up.  Patient was last seen in office by Dr. Cherylann Ratel on 07/09/2021.    Patient laying in bed  Alert and oriented Tolerating meals  Room air  Creatinine 2.48 UOP 2.9L in past 24 hours   Objective:  Vital signs in last 24 hours:  Temp:  [98 F (36.7 C)-98.7 F (37.1 C)] 98.2 F (36.8 C) (10/02 1133) Pulse Rate:  [75-97] 76 (10/02 1133) Resp:  [16-18] 16 (10/02 1133) BP: (100-156)/(49-126) 104/62 (10/02 1133) SpO2:  [97 %-100 %] 100 % (10/02 1133)  Weight change:  Filed Weights   08/08/23 1236  Weight: 126.1 kg    Intake/Output: I/O last 3 completed shifts: In: 480 [P.O.:480] Out: 4850 [Urine:4850]   Intake/Output this shift:  No intake/output data recorded.  Physical Exam: General: NAD  Head: Normocephalic, atraumatic. Moist oral mucosal membranes  Eyes: Anicteric  Lungs:  Clear to auscultation, normal effort  Heart: Regular rate and rhythm  Abdomen:  Soft, nontender  Extremities:  No peripheral edema.  Neurologic: Alert, moving all four extremities  Skin: No lesions  Access: None    Basic Metabolic Panel: Recent Labs  Lab 08/08/23 1240 08/09/23 0704 08/10/23 0337  NA 134* 136 136  K 5.3* 5.5* 5.6*  CL 109 113* 114*  CO2 15* 16* 17*  GLUCOSE 119* 104* 106*  BUN 73* 60* 52*  CREATININE 4.14* 3.07* 2.48*  CALCIUM 9.1 8.7* 8.9    Liver Function Tests: Recent Labs  Lab 08/08/23 1240  AST 18  ALT 18  ALKPHOS 63  BILITOT 0.8  PROT 8.1  ALBUMIN 4.0   No results for input(s): "LIPASE",  "AMYLASE" in the last 168 hours. No results for input(s): "AMMONIA" in the last 168 hours.  CBC: Recent Labs  Lab 08/08/23 1240 08/09/23 0704  WBC 5.4 5.5  HGB 11.2* 10.5*  HCT 34.5* 32.8*  MCV 88.7 90.6  PLT 181 181    Cardiac Enzymes: No results for input(s): "CKTOTAL", "CKMB", "CKMBINDEX", "TROPONINI" in the last 168 hours.  BNP: Invalid input(s): "POCBNP"  CBG: Recent Labs  Lab 08/08/23 2141 08/09/23 0828 08/09/23 1231 08/09/23 1648 08/10/23 0432  GLUCAP 105* 104* 120* 110* 102*    Microbiology: Results for orders placed or performed in visit on 04/20/22  Fecal occult blood, imunochemical([facility])     Status: None   Collection Time: 04/29/22 12:00 AM  Result Value Ref Range Status   Fecal Occult Bld Negative Negative Final    Coagulation Studies: No results for input(s): "LABPROT", "INR" in the last 72 hours.  Urinalysis: Recent Labs    08/08/23 1240  COLORURINE YELLOW*  LABSPEC 1.011  PHURINE 5.0  GLUCOSEU 50*  HGBUR SMALL*  BILIRUBINUR NEGATIVE  KETONESUR NEGATIVE  PROTEINUR NEGATIVE  NITRITE NEGATIVE  LEUKOCYTESUR SMALL*      Imaging: US RENAL  Result Date: 08/08/2023 CLINICAL DATA:  Acute kidney injury. EXAM: RENAL / URINARY TRACT ULTRASOUND COMPLETE COMPARISON:  08/19/2020.  Abdomen and pelvis CT dated 08/08/2023. FINDINGS: Right Kidney: Renal measurements: 9.4 x 5.2 x 5.0 cm = volume: 129 mL. Echogenicity within normal limits.  No mass or hydronephrosis visualized. Left Kidney: Renal measurements: 9.8 x 5.5 x 5.1 cm = volume: 144 mL. Echogenicity within normal limits. 2.4 cm simple cyst. This does not need imaging follow-up. No mass or hydronephrosis visualized. Bladder: Appears normal for degree of bladder distention. Other: Moderately enlarged prostate gland protruding into the base of the urinary bladder. IMPRESSION: 1. No acute abnormality. 2. Moderately enlarged prostate gland protruding into the base of the urinary bladder.  Electronically Signed   By: Beckie Salts M.D.   On: 08/08/2023 18:14   CT Renal Stone Study  Result Date: 08/08/2023 CLINICAL DATA:  Abdominal and flank pain.  Dysuria. EXAM: CT ABDOMEN AND PELVIS WITHOUT CONTRAST TECHNIQUE: Multidetector CT imaging of the abdomen and pelvis was performed following the standard protocol without IV contrast. RADIATION DOSE REDUCTION: This exam was performed according to the departmental dose-optimization program which includes automated exposure control, adjustment of the mA and/or kV according to patient size and/or use of iterative reconstruction technique. COMPARISON:  None Available. FINDINGS: Lower chest: No acute findings. Hepatobiliary: No mass visualized on this unenhanced exam. Gallbladder is unremarkable. No evidence of biliary ductal dilatation. Pancreas: No mass or inflammatory process visualized on this unenhanced exam. Spleen:  Within normal limits in size. Adrenals/Urinary tract: No evidence of urolithiasis or hydronephrosis. Unremarkable unopacified urinary bladder. Stomach/Bowel: No evidence of obstruction, inflammatory process, or abnormal fluid collections. Normal appendix visualized. Vascular/Lymphatic: No pathologically enlarged lymph nodes identified. No evidence of abdominal aortic aneurysm. Reproductive:  Mildly enlarged prostate. Other:  None. Musculoskeletal:  No suspicious bone lesions identified. IMPRESSION: No evidence of urolithiasis, hydronephrosis, or other acute findings. Mildly enlarged prostate. Electronically Signed   By: Danae Orleans M.D.   On: 08/08/2023 17:01     Medications:    sodium chloride 75 mL/hr at 08/10/23 1144    allopurinol  100 mg Oral Daily   atorvastatin  10 mg Oral QHS   heparin  5,000 Units Subcutaneous Q8H   latanoprost  1 drop Both Eyes QHS   tamsulosin  0.4 mg Oral Daily   acetaminophen **OR** acetaminophen, docusate sodium, hydrALAZINE, nicotine **OR** nicotine polacrilex, ondansetron **OR** ondansetron  (ZOFRAN) IV, mouth rinse, senna-docusate  Assessment/ Plan:  Austin Dudley is a 67 y.o.  male with past medical conditions including diabetes, tobacco use, hypertension, and chronic kidney disease stage IIIa.  Patient presents to the emergency department with abnormal labs and has been diagnosed with AKI (acute kidney injury) (HCC) [N17.9] Acute renal failure, unspecified acute renal failure type (HCC) [N17.9]   Acute Kidney Injury on chronic kidney disease stage IIIa with baseline creatinine 1.37 on 03/23/22.  Acute kidney injury suspected secondary to poor oral intake. Chronic kidney disease is secondary to diabetes and hypertension.  Renal ultrasound negative for obstruction, enlarged prostate noted.  CT renal stone negative for obstruction.    Creatinine continues to improve with IVF, appetite appropriate. Will continue IVF tonight. No acute need for dialysis. Will continue to monitor patient. If creatinine less than 2 in am, patient cleared to discharge from renal stance.   Lab Results  Component Value Date   CREATININE 2.48 (H) 08/10/2023   CREATININE 3.07 (H) 08/09/2023   CREATININE 4.14 (H) 08/08/2023    Intake/Output Summary (Last 24 hours) at 08/10/2023 1401 Last data filed at 08/10/2023 0500 Gross per 24 hour  Intake 240 ml  Output 1450 ml  Net -1210 ml   2.  Hypertension with chronic kidney disease.  Home regimen includes lisinopril-hydrochlorothiazide.  Remains held  3. Anemia of chronic kidney disease Lab Results  Component Value Date   HGB 10.5 (L) 08/09/2023    Hemoglobin 10.5.  No need for ESA's at this time.    4. Diabetes mellitus type II with chronic kidney disease/renal manifestations: noninsulin dependent. Home regimen includes Jardiance. Most recent hemoglobin A1c is 5.9 on 08/09/23.     LOS: 2 Lavone Weisel 10/2/20242:01 PM

## 2023-08-10 NOTE — Progress Notes (Signed)
PROGRESS NOTE    Austin Dudley  ZOX:[id number] DOB: 03-08-1956 DOA: 08/08/2023 PCP: [facility]    Brief Narrative:   Austin Dudley is a 67 y.o. male with medical history significant for hypertension, type II DM, CKD stage IIIa, history of tobacco use disorder, who presented to the hospital because of abnormal labs and concern for urinary obstruction.   He was found to have acute kidney injury with hyperkalemia.  Assessment & Plan:   Principal Problem:   AKI (acute kidney injury) (HCC) Active Problems:   Essential hypertension   Tobacco use   Type 2 diabetes mellitus without complications (HCC)   Hyperkalemia   Hyperlipidemia  Acute kidney injury on CKD stage IIIa, hyperkalemia: Kidney function is improving.  Will continue IV fluids for elevated creatinine.  If creatinine less than 2 on 10/3 cleared for discharge.  Treat hyperkalemia with insulin 10 units +1 amp of D50.  No indication for dialysis.  Patient eating and drinking well.    Hypertension: Lisinopril-HCTZ has been held     Type II DM: Hemoglobin A1c is 5.9.  Glucose level are optimal.  No need for CBGs or carb modified diet     Hyperlipidemia: Continue atorvastatin    DVT prophylaxis: SQ heparin Code Status: Full Family Communication: None today Disposition Plan: Status is: Inpatient Remains inpatient appropriate because: Hyperkalemia with AKI   Level of care: Telemetry Medical  Consultants:  Nephrology  Procedures:  None  Antimicrobials: None   Subjective: Seen and examined.  Resting in bed.  No visible distress.  No complaints.  Objective: Vitals:   08/10/23 0355 08/10/23 0756 08/10/23 1133 08/10/23 1420  BP: 115/65 128/83 104/62 (!) 103/51  Pulse: 75 80 76 83  Resp: 18 18 16 16   Temp: 98 F (36.7 C) 98.4 F (36.9 C) 98.2 F (36.8 C) 98.3 F (36.8 C)  TempSrc: Oral Oral Oral Oral  SpO2: 100% 97% 100% 100%  Weight:      Height:        Intake/Output Summary  (Last 24 hours) at 08/10/2023 1449 Last data filed at 08/10/2023 0500 Gross per 24 hour  Intake 240 ml  Output 1450 ml  Net -1210 ml   Filed Weights   08/08/23 1236  Weight: 126.1 kg    Examination:  General exam: NAD Respiratory system: Clear to auscultation. Respiratory effort normal. Cardiovascular system: S1-S2, RRR, no murmurs, no pedal edema Gastrointestinal system: Soft,/ND, normal bowel sounds Central nervous system: Alert and oriented. No focal neurological deficits. Extremities: Symmetric 5 x 5 power. Skin: No rashes, lesions or ulcers Psychiatry: Judgement and insight appear normal. Mood & affect appropriate.     Data Reviewed: I have personally reviewed following labs and imaging studies  CBC: Recent Labs  Lab 08/08/23 1240 08/09/23 0704  WBC 5.4 5.5  HGB 11.2* 10.5*  HCT 34.5* 32.8*  MCV 88.7 90.6  PLT 181 181   Basic Metabolic Panel: Recent Labs  Lab 08/08/23 1240 08/09/23 0704 08/10/23 0337  NA 134* 136 136  K 5.3* 5.5* 5.6*  CL 109 113* 114*  CO2 15* 16* 17*  GLUCOSE 119* 104* 106*  BUN 73* 60* 52*  CREATININE 4.14* 3.07* 2.48*  CALCIUM 9.1 8.7* 8.9   GFR: Estimated Creatinine Clearance: 43 mL/min (A) (by C-G formula based on SCr of 2.48 mg/dL (H)). Liver Function Tests: Recent Labs  Lab 08/08/23 1240  AST 18  ALT 18  ALKPHOS 63  BILITOT 0.8  PROT 8.1  ALBUMIN 4.0  No results for input(s): "LIPASE", "AMYLASE" in the last 168 hours. No results for input(s): "AMMONIA" in the last 168 hours. Coagulation Profile: No results for input(s): "INR", "PROTIME" in the last 168 hours. Cardiac Enzymes: No results for input(s): "CKTOTAL", "CKMB", "CKMBINDEX", "TROPONINI" in the last 168 hours. BNP (last 3 results) No results for input(s): "PROBNP" in the last 8760 hours. HbA1C: Recent Labs    08/09/23 0704  HGBA1C 5.9*   CBG: Recent Labs  Lab 08/08/23 2141 08/09/23 0828 08/09/23 1231 08/09/23 1648 08/10/23 0432  GLUCAP 105* 104*  120* 110* 102*   Lipid Profile: No results for input(s): "CHOL", "HDL", "LDLCALC", "TRIG", "CHOLHDL", "LDLDIRECT" in the last 72 hours. Thyroid Function Tests: No results for input(s): "TSH", "T4TOTAL", "FREET4", "T3FREE", "THYROIDAB" in the last 72 hours. Anemia Panel: No results for input(s): "VITAMINB12", "FOLATE", "FERRITIN", "TIBC", "IRON", "RETICCTPCT" in the last 72 hours. Sepsis Labs: No results for input(s): "PROCALCITON", "LATICACIDVEN" in the last 168 hours.  No results found for this or any previous visit (from the past 240 hour(s)).       Radiology Studies: US RENAL  Result Date: 08/08/2023 CLINICAL DATA:  Acute kidney injury. EXAM: RENAL / URINARY TRACT ULTRASOUND COMPLETE COMPARISON:  08/19/2020.  Abdomen and pelvis CT dated 08/08/2023. FINDINGS: Right Kidney: Renal measurements: 9.4 x 5.2 x 5.0 cm = volume: 129 mL. Echogenicity within normal limits. No mass or hydronephrosis visualized. Left Kidney: Renal measurements: 9.8 x 5.5 x 5.1 cm = volume: 144 mL. Echogenicity within normal limits. 2.4 cm simple cyst. This does not need imaging follow-up. No mass or hydronephrosis visualized. Bladder: Appears normal for degree of bladder distention. Other: Moderately enlarged prostate gland protruding into the base of the urinary bladder. IMPRESSION: 1. No acute abnormality. 2. Moderately enlarged prostate gland protruding into the base of the urinary bladder. Electronically Signed   By: Beckie Salts M.D.   On: 08/08/2023 18:14        Scheduled Meds:  allopurinol  100 mg Oral Daily   atorvastatin  10 mg Oral QHS   heparin  5,000 Units Subcutaneous Q8H   latanoprost  1 drop Both Eyes QHS   tamsulosin  0.4 mg Oral Daily   Continuous Infusions:  sodium chloride 75 mL/hr at 08/10/23 1144     LOS: 2 days     Tresa Moore, MD [facility]   If 7PM-7AM, please contact night-coverage  08/10/2023, 2:49 PM

## 2023-08-11 DIAGNOSIS — N179 Acute kidney failure, unspecified: Secondary | ICD-10-CM | POA: Diagnosis not present

## 2023-08-11 LAB — CBC
HCT: 30.3 % — ABNORMAL LOW (ref 39.0–52.0)
Hemoglobin: 9.9 g/dL — ABNORMAL LOW (ref 13.0–17.0)
MCH: 28.8 pg (ref 26.0–34.0)
MCHC: 32.7 g/dL (ref 30.0–36.0)
MCV: 88.1 fL (ref 80.0–100.0)
Platelets: 161 10*3/uL (ref 150–400)
RBC: 3.44 MIL/uL — ABNORMAL LOW (ref 4.22–5.81)
RDW: 14.4 % (ref 11.5–15.5)
WBC: 5.5 10*3/uL (ref 4.0–10.5)
nRBC: 0 % (ref 0.0–0.2)

## 2023-08-11 LAB — BASIC METABOLIC PANEL
Anion gap: 6 (ref 5–15)
BUN: 40 mg/dL — ABNORMAL HIGH (ref 8–23)
CO2: 19 mmol/L — ABNORMAL LOW (ref 22–32)
Calcium: 8.7 mg/dL — ABNORMAL LOW (ref 8.9–10.3)
Chloride: 112 mmol/L — ABNORMAL HIGH (ref 98–111)
Creatinine, Ser: 2.2 mg/dL — ABNORMAL HIGH (ref 0.61–1.24)
GFR, Estimated: 32 mL/min — ABNORMAL LOW (ref >60)
Glucose, Bld: 106 mg/dL — ABNORMAL HIGH (ref 70–99)
Potassium: 5.4 mmol/L — ABNORMAL HIGH (ref 3.5–5.1)
Sodium: 137 mmol/L (ref 135–145)

## 2023-08-11 LAB — GLUCOSE, CAPILLARY: Glucose-Capillary: 113 mg/dL — ABNORMAL HIGH (ref 70–99)

## 2023-08-11 MED ORDER — SODIUM ZIRCONIUM CYCLOSILICATE 10 G PO PACK
10.0000 g | PACK | Freq: Once | ORAL | Status: AC
  Administered 2023-08-11: 10 g via ORAL
  Filled 2023-08-11: qty 1

## 2023-08-11 NOTE — Discharge Summary (Signed)
Physician Discharge Summary  Kal Guardado WUJ:[id number] DOB: 1956-01-11 DOA: 08/08/2023  PCP: [facility]  Admit date: 08/08/2023 Discharge date: 08/11/2023  Admitted From: Home Disposition:  Home  Recommendations for Outpatient Follow-up:  Follow up with PCP in 1-2 weeks Follow-up with nephrology 1 to 2 weeks  Home Health: No Equipment/Devices: None  Discharge Condition: Stable CODE STATUS: Full Diet recommendation: Heart healthy  Brief/Interim Summary:   Austin Dudley is a 67 y.o. male with medical history significant for hypertension, type II DM, CKD stage IIIa, history of tobacco use disorder, who presented to the hospital because of abnormal labs and concern for urinary obstruction.   He was found to have acute kidney injury with hyperkalemia.   Discharge Diagnoses:  Principal Problem:   AKI (acute kidney injury) (HCC) Active Problems:   Essential hypertension   Tobacco use   Type 2 diabetes mellitus without complications (HCC)   Hyperkalemia   Hyperlipidemia    Acute kidney injury on CKD stage IIIa, hyperkalemia: Kidney function is improving.  IV fluids discontinued.  Cleared for discharge.  At time of discharge will hold home lisinopril/hydrochlorothiazide.  Follow-up outpatient nephrology 2 weeks.  Discussed with nephrology service.     Hypertension: Lisinopril-HCTZ has been held     Type II DM: Hemoglobin A1c is 5.9.  Glucose level are optimal.  No need for CBGs or carb modified diet     Hyperlipidemia: Continue atorvastatin  Discharge Instructions  Discharge Instructions     Diet - low sodium heart healthy   Complete by: As directed    Increase activity slowly   Complete by: As directed       Allergies as of 08/11/2023       Reactions   Amoxicillin Rash   Ampicillin Rash        Medication List     STOP taking these medications    lisinopril-hydrochlorothiazide 20-25 MG tablet Commonly known as: ZESTORETIC        TAKE these medications    allopurinol 100 MG tablet Commonly known as: ZYLOPRIM Take 100 mg by mouth daily.   aspirin EC 81 MG tablet Take 81 mg by mouth daily. Swallow whole.   atorvastatin 10 MG tablet Commonly known as: LIPITOR Take 1 tablet by mouth once daily   Jardiance 25 MG Tabs tablet Generic drug: empagliflozin Take 25 mg by mouth daily.   latanoprost 0.005 % ophthalmic solution Commonly known as: XALATAN Place 1 drop into both eyes at bedtime.   tamsulosin 0.4 MG Caps capsule Commonly known as: FLOMAX Take 0.4 mg by mouth daily.        Allergies  Allergen Reactions   Amoxicillin Rash   Ampicillin Rash    Consultations: Nephrology   Procedures/Studies: US RENAL  Result Date: 08/08/2023 CLINICAL DATA:  Acute kidney injury. EXAM: RENAL / URINARY TRACT ULTRASOUND COMPLETE COMPARISON:  08/19/2020.  Abdomen and pelvis CT dated 08/08/2023. FINDINGS: Right Kidney: Renal measurements: 9.4 x 5.2 x 5.0 cm = volume: 129 mL. Echogenicity within normal limits. No mass or hydronephrosis visualized. Left Kidney: Renal measurements: 9.8 x 5.5 x 5.1 cm = volume: 144 mL. Echogenicity within normal limits. 2.4 cm simple cyst. This does not need imaging follow-up. No mass or hydronephrosis visualized. Bladder: Appears normal for degree of bladder distention. Other: Moderately enlarged prostate gland protruding into the base of the urinary bladder. IMPRESSION: 1. No acute abnormality. 2. Moderately enlarged prostate gland protruding into the base of the urinary bladder. Electronically Signed  By: Beckie Salts M.D.   On: 08/08/2023 18:14   CT Renal Stone Study  Result Date: 08/08/2023 CLINICAL DATA:  Abdominal and flank pain.  Dysuria. EXAM: CT ABDOMEN AND PELVIS WITHOUT CONTRAST TECHNIQUE: Multidetector CT imaging of the abdomen and pelvis was performed following the standard protocol without IV contrast. RADIATION DOSE REDUCTION: This exam was performed according to the  departmental dose-optimization program which includes automated exposure control, adjustment of the mA and/or kV according to patient size and/or use of iterative reconstruction technique. COMPARISON:  None Available. FINDINGS: Lower chest: No acute findings. Hepatobiliary: No mass visualized on this unenhanced exam. Gallbladder is unremarkable. No evidence of biliary ductal dilatation. Pancreas: No mass or inflammatory process visualized on this unenhanced exam. Spleen:  Within normal limits in size. Adrenals/Urinary tract: No evidence of urolithiasis or hydronephrosis. Unremarkable unopacified urinary bladder. Stomach/Bowel: No evidence of obstruction, inflammatory process, or abnormal fluid collections. Normal appendix visualized. Vascular/Lymphatic: No pathologically enlarged lymph nodes identified. No evidence of abdominal aortic aneurysm. Reproductive:  Mildly enlarged prostate. Other:  None. Musculoskeletal:  No suspicious bone lesions identified. IMPRESSION: No evidence of urolithiasis, hydronephrosis, or other acute findings. Mildly enlarged prostate. Electronically Signed   By: Danae Orleans M.D.   On: 08/08/2023 17:01      Subjective: Seen and examined on the day of discharge.  Stable no distress.  Appropriate for discharge home.  Discharge Exam: Vitals:   08/11/23 0421 08/11/23 0912  BP: 114/66 115/69  Pulse: 82 75  Resp: 18 20  Temp: 98.2 F (36.8 C) 97.7 F (36.5 C)  SpO2: 98% 100%   Vitals:   08/10/23 2019 08/10/23 2300 08/11/23 0421 08/11/23 0912  BP: (!) 94/44 116/65 114/66 115/69  Pulse: 87 78 82 75  Resp: 18  18 20   Temp: 98.2 F (36.8 C)  98.2 F (36.8 C) 97.7 F (36.5 C)  TempSrc: Oral  Oral Oral  SpO2: 100%  98% 100%  Weight:      Height:        General: Pt is alert, awake, not in acute distress Cardiovascular: RRR, S1/S2 +, no rubs, no gallops Respiratory: CTA bilaterally, no wheezing, no rhonchi Abdominal: Soft, NT, ND, bowel sounds + Extremities: no edema,  no cyanosis    The results of significant diagnostics from this hospitalization (including imaging, microbiology, ancillary and laboratory) are listed below for reference.     Microbiology: No results found for this or any previous visit (from the past 240 hour(s)).   Labs: BNP (last 3 results) No results for input(s): "BNP" in the last 8760 hours. Basic Metabolic Panel: Recent Labs  Lab 08/08/23 1240 08/09/23 0704 08/10/23 0337 08/11/23 0601  NA 134* 136 136 137  K 5.3* 5.5* 5.6* 5.4*  CL 109 113* 114* 112*  CO2 15* 16* 17* 19*  GLUCOSE 119* 104* 106* 106*  BUN 73* 60* 52* 40*  CREATININE 4.14* 3.07* 2.48* 2.20*  CALCIUM 9.1 8.7* 8.9 8.7*   Liver Function Tests: Recent Labs  Lab 08/08/23 1240  AST 18  ALT 18  ALKPHOS 63  BILITOT 0.8  PROT 8.1  ALBUMIN 4.0   No results for input(s): "LIPASE", "AMYLASE" in the last 168 hours. No results for input(s): "AMMONIA" in the last 168 hours. CBC: Recent Labs  Lab 08/08/23 1240 08/09/23 0704 08/11/23 0601  WBC 5.4 5.5 5.5  HGB 11.2* 10.5* 9.9*  HCT 34.5* 32.8* 30.3*  MCV 88.7 90.6 88.1  PLT 181 181 161   Cardiac Enzymes: No results  for input(s): "CKTOTAL", "CKMB", "CKMBINDEX", "TROPONINI" in the last 168 hours. BNP: Invalid input(s): "POCBNP" CBG: Recent Labs  Lab 08/09/23 0828 08/09/23 1231 08/09/23 1648 08/10/23 0432 08/11/23 0417  GLUCAP 104* 120* 110* 102* 113*   D-Dimer No results for input(s): "DDIMER" in the last 72 hours. Hgb A1c Recent Labs    08/09/23 0704  HGBA1C 5.9*   Lipid Profile No results for input(s): "CHOL", "HDL", "LDLCALC", "TRIG", "CHOLHDL", "LDLDIRECT" in the last 72 hours. Thyroid function studies No results for input(s): "TSH", "T4TOTAL", "T3FREE", "THYROIDAB" in the last 72 hours.  Invalid input(s): "FREET3" Anemia work up No results for input(s): "VITAMINB12", "FOLATE", "FERRITIN", "TIBC", "IRON", "RETICCTPCT" in the last 72 hours. Urinalysis    Component Value  Date/Time   COLORURINE YELLOW (A) 08/08/2023 1240   APPEARANCEUR CLEAR (A) 08/08/2023 1240   LABSPEC 1.011 08/08/2023 1240   PHURINE 5.0 08/08/2023 1240   GLUCOSEU 50 (A) 08/08/2023 1240   HGBUR SMALL (A) 08/08/2023 1240   BILIRUBINUR NEGATIVE 08/08/2023 1240   KETONESUR NEGATIVE 08/08/2023 1240   PROTEINUR NEGATIVE 08/08/2023 1240   NITRITE NEGATIVE 08/08/2023 1240   LEUKOCYTESUR SMALL (A) 08/08/2023 1240   Sepsis Labs Recent Labs  Lab 08/08/23 1240 08/09/23 0704 08/11/23 0601  WBC 5.4 5.5 5.5   Microbiology No results found for this or any previous visit (from the past 240 hour(s)).   Time coordinating discharge: Over 30 minutes  SIGNED:   Tresa Moore, MD  [facility] 08/11/2023, 1:41 PM Pager   If 7PM-7AM, please contact night-coverage

## 2023-08-11 NOTE — Plan of Care (Signed)

## 2023-08-11 NOTE — Progress Notes (Signed)
[facility]  ROUNDING NOTE   Subjective:   Austin Dudley is a 67 year old male with past medical conditions including diabetes, tobacco use, hypertension, and chronic kidney disease stage IIIa.  Patient presents to the emergency department with abnormal labs and has been diagnosed with AKI (acute kidney injury) (HCC) [N17.9] Acute renal failure, unspecified acute renal failure type [facility]) [N17.9]  Patient is known to our practice but was lost to follow-up.  Patient was last seen in office by Dr. Cherylann Ratel on 07/09/2021.    Update:  Patient resting quietly in bed Completed breakfast tray at bedside States he feels well today, requesting discharge  Creatinine 2.20  Objective:  Vital signs in last 24 hours:  Temp:  [97.7 F (36.5 C)-98.2 F (36.8 C)] 97.7 F (36.5 C) (10/03 0912) Pulse Rate:  [75-87] 75 (10/03 0912) Resp:  [18-20] 20 (10/03 0912) BP: (94-116)/(44-69) 115/69 (10/03 0912) SpO2:  [98 %-100 %] 100 % (10/03 0912)  Weight change:  Filed Weights   08/08/23 1236  Weight: 126.1 kg    Intake/Output: I/O last 3 completed shifts: In: 1791.2 [P.O.:480; I.V.:1311.2] Out: 2300 [Urine:2300]   Intake/Output this shift:  Total I/O In: 240 [P.O.:240] Out: -   Physical Exam: General: NAD  Head: Normocephalic, atraumatic. Moist oral mucosal membranes  Eyes: Anicteric  Lungs:  Clear to auscultation, normal effort  Heart: Regular rate and rhythm  Abdomen:  Soft, nontender  Extremities:  No peripheral edema.  Neurologic: Alert, moving all four extremities  Skin: No lesions  Access: None    Basic Metabolic Panel: Recent Labs  Lab 08/08/23 1240 08/09/23 0704 08/10/23 0337 08/11/23 0601  NA 134* 136 136 137  K 5.3* 5.5* 5.6* 5.4*  CL 109 113* 114* 112*  CO2 15* 16* 17* 19*  GLUCOSE 119* 104* 106* 106*  BUN 73* 60* 52* 40*  CREATININE 4.14* 3.07* 2.48* 2.20*  CALCIUM 9.1 8.7* 8.9 8.7*    Liver Function Tests: Recent Labs  Lab 08/08/23 1240  AST  18  ALT 18  ALKPHOS 63  BILITOT 0.8  PROT 8.1  ALBUMIN 4.0   No results for input(s): "LIPASE", "AMYLASE" in the last 168 hours. No results for input(s): "AMMONIA" in the last 168 hours.  CBC: Recent Labs  Lab 08/08/23 1240 08/09/23 0704 08/11/23 0601  WBC 5.4 5.5 5.5  HGB 11.2* 10.5* 9.9*  HCT 34.5* 32.8* 30.3*  MCV 88.7 90.6 88.1  PLT 181 181 161    Cardiac Enzymes: No results for input(s): "CKTOTAL", "CKMB", "CKMBINDEX", "TROPONINI" in the last 168 hours.  BNP: Invalid input(s): "POCBNP"  CBG: Recent Labs  Lab 08/09/23 0828 08/09/23 1231 08/09/23 1648 08/10/23 0432 08/11/23 0417  GLUCAP 104* 120* 110* 102* 113*    Microbiology: Results for orders placed or performed in visit on 04/20/22  Fecal occult blood, imunochemical([facility])     Status: None   Collection Time: 04/29/22 12:00 AM  Result Value Ref Range Status   Fecal Occult Bld Negative Negative Final    Coagulation Studies: No results for input(s): "LABPROT", "INR" in the last 72 hours.  Urinalysis: No results for input(s): "COLORURINE", "LABSPEC", "PHURINE", "GLUCOSEU", "HGBUR", "BILIRUBINUR", "KETONESUR", "PROTEINUR", "UROBILINOGEN", "NITRITE", "LEUKOCYTESUR" in the last 72 hours.  Invalid input(s): "APPERANCEUR"     Imaging: No results found.   Medications:      allopurinol  100 mg Oral Daily   atorvastatin  10 mg Oral QHS   heparin  5,000 Units Subcutaneous Q8H   latanoprost  1 drop Both Eyes  QHS   tamsulosin  0.4 mg Oral Daily   acetaminophen **OR** acetaminophen, docusate sodium, hydrALAZINE, nicotine **OR** nicotine polacrilex, ondansetron **OR** ondansetron (ZOFRAN) IV, mouth rinse, senna-docusate  Assessment/ Plan:  Mr. Austin Dudley is a 67 y.o.  male with past medical conditions including diabetes, tobacco use, hypertension, and chronic kidney disease stage IIIa.  Patient presents to the emergency department with abnormal labs and has been diagnosed with AKI (acute  kidney injury) (HCC) [N17.9] Acute renal failure, unspecified acute renal failure type (HCC) [N17.9]   Acute Kidney Injury on chronic kidney disease stage IIIa with baseline creatinine 1.37 on 03/23/22.  Acute kidney injury suspected secondary to poor oral intake. Chronic kidney disease is secondary to diabetes and hypertension.  Renal ultrasound negative for obstruction, enlarged prostate noted.  CT renal stone negative for obstruction.    Creatinine continues to improve, 2.20 today.  The remains elevated above stated baseline, we feel patient is safe to discharge with follow-up in our office in 1 to 2 weeks.  Patient encouraged to maintain fluid and oral intake  Lab Results  Component Value Date   CREATININE 2.20 (H) 08/11/2023   CREATININE 2.48 (H) 08/10/2023   CREATININE 3.07 (H) 08/09/2023    Intake/Output Summary (Last 24 hours) at 08/11/2023 1512 Last data filed at 08/11/2023 0917 Gross per 24 hour  Intake 1791.17 ml  Output 850 ml  Net 941.17 ml   2.  Hypertension with chronic kidney disease.  Home regimen includes lisinopril-hydrochlorothiazide.  Continue to hold antihypertensives until follow-up appointment.  3. Anemia of chronic kidney disease Lab Results  Component Value Date   HGB 9.9 (L) 08/11/2023    Hemoglobin within goal.  No need for ESA's at this time.    4. Diabetes mellitus type II with chronic kidney disease/renal manifestations: noninsulin dependent. Home regimen includes Jardiance. Most recent hemoglobin A1c is 5.9 on 08/09/23.   Glucose well-controlled.   LOS: 3 [name] 10/3/20243:12 PM

## 2023-09-07 ENCOUNTER — Ambulatory Visit: Payer: 59 | Admitting: Urology

## 2023-09-20 ENCOUNTER — Ambulatory Visit: Payer: 59 | Admitting: Urology

## 2023-12-22 ENCOUNTER — Other Ambulatory Visit: Payer: Self-pay | Admitting: Family Medicine

## 2023-12-22 DIAGNOSIS — F17200 Nicotine dependence, unspecified, uncomplicated: Secondary | ICD-10-CM

## 2023-12-29 ENCOUNTER — Ambulatory Visit
Admission: RE | Admit: 2023-12-29 | Discharge: 2023-12-29 | Disposition: A | Discharge status: Home / Self Care | Payer: 59 | Source: Ambulatory Visit | Attending: Family Medicine | Admitting: Family Medicine

## 2023-12-29 DIAGNOSIS — F17200 Nicotine dependence, unspecified, uncomplicated: Secondary | ICD-10-CM | POA: Diagnosis present

## 2023-12-29 DIAGNOSIS — Z87891 Personal history of nicotine dependence: Secondary | ICD-10-CM | POA: Insufficient documentation

## 2023-12-29 DIAGNOSIS — J439 Emphysema, unspecified: Secondary | ICD-10-CM | POA: Diagnosis not present

## 2023-12-29 DIAGNOSIS — Z122 Encounter for screening for malignant neoplasm of respiratory organs: Secondary | ICD-10-CM | POA: Insufficient documentation

## 2023-12-29 DIAGNOSIS — I700 Atherosclerosis of aorta: Secondary | ICD-10-CM | POA: Diagnosis not present

## 2024-02-06 ENCOUNTER — Encounter: Payer: Self-pay | Admitting: Urology

## 2024-02-06 ENCOUNTER — Ambulatory Visit (INDEPENDENT_AMBULATORY_CARE_PROVIDER_SITE_OTHER): Payer: 59 | Admitting: Urology

## 2024-02-06 VITALS — BP 113/62 | HR 83 | Ht 79.0 in | Wt 285.0 lb

## 2024-02-06 DIAGNOSIS — R972 Elevated prostate specific antigen [PSA]: Secondary | ICD-10-CM | POA: Diagnosis not present

## 2024-02-06 DIAGNOSIS — Z125 Encounter for screening for malignant neoplasm of prostate: Secondary | ICD-10-CM

## 2024-02-06 NOTE — Progress Notes (Signed)
 I, Maysun Anabel Bene, acting as a scribe for Riki Altes, MD., have documented all relevant documentation on the behalf of Riki Altes, MD, as directed by Riki Altes, MD while in the presence of Riki Altes, MD.  02/06/2024 6:54 PM   Austin Dudley [birth year] [id number]  Referring provider: Leanord Asal, Nelva Bush, MD [address] [place],  [place] [address]  Chief Complaint  Patient presents with   Elevated PSA    HPI: Austin Dudley is a 68 y.o. male referred for evaluation of an elevated PSA.  Forwarded records were reviewed and a PSA drawn August 2024 was 6.0. No prior PSA results available for comparison.  Urinalysis September 2024 showed 6-10 WBCs.  He has no bothersome lower urinary tract symptoms. Denies flank, abdominal, or pelvic pain.  No history of gross hematuria.  Denies family history of prostate cancer and first-degree relatives.  No previous urologic history or prior urologic evaluation.  Followed by nephrology for a stay for CKD   PMH: Past Medical History:  Diagnosis Date   Chronic kidney disease    Vertigo     Surgical History: Past Surgical History:  Procedure Laterality Date   COLONOSCOPY WITH PROPOFOL N/A 09/03/2020   Procedure: COLONOSCOPY WITH PROPOFOL;  Surgeon: Pasty Spillers, MD;  Location: [facility] ENDOSCOPY;  Service: Endoscopy;  Laterality: N/A;   COLONOSCOPY WITH PROPOFOL N/A 04/14/2022   Procedure: COLONOSCOPY WITH PROPOFOL;  Surgeon: Toney Reil, MD;  Location: [facility] ENDOSCOPY;  Service: Gastroenterology;  Laterality: N/A;    Home Medications:  Allergies as of 02/06/2024       Reactions   Amoxicillin Rash   Ampicillin Rash        Medication List        Accurate as of February 06, 2024  6:54 PM. If you have any questions, ask your nurse or doctor.          allopurinol 100 MG tablet Commonly known as: ZYLOPRIM Take 100 mg by mouth daily.   aspirin EC 81 MG tablet Take 81 mg by mouth daily. Swallow  whole.   atorvastatin 10 MG tablet Commonly known as: LIPITOR Take 1 tablet by mouth once daily   Jardiance 25 MG Tabs tablet Generic drug: empagliflozin Take 25 mg by mouth daily.   latanoprost 0.005 % ophthalmic solution Commonly known as: XALATAN Place 1 drop into both eyes at bedtime.   tamsulosin 0.4 MG Caps capsule Commonly known as: FLOMAX Take 0.4 mg by mouth daily.        Allergies:  Allergies  Allergen Reactions   Amoxicillin Rash   Ampicillin Rash    Family History: Family History  Problem Relation Age of Onset   Stroke Father    Diabetes Sister     Social History:  reports that he has been smoking cigarettes. He started smoking about 50 years ago. He has a 11.8 pack-year smoking history. He has never used smokeless tobacco. He reports that he does not currently use alcohol. He reports that he does not currently use drugs.   Physical Exam: BP 113/62   Pulse 83   Ht 6\' 7"  (2.007 m)   Wt 285 lb (129.3 kg)   BMI 32.11 kg/m   Constitutional:  Alert and oriented, No acute distress. HEENT: [place] AT, moist mucus membranes.  Trachea midline, no masses. Cardiovascular: No clubbing, cyanosis, or edema. Respiratory: Normal respiratory effort, no increased work of breathing. GI: Abdomen is soft, nontender, nondistended, no abdominal masses  GU: Prostate flat smooth without nodules, estimated volume 50 grams.  Skin: No rashes, bruises or suspicious lesions. Neurologic: Grossly intact, no focal deficits, moving all 4 extremities. Psychiatric: Normal mood and affect.  Assessment & Plan:    1. Elevated PSA Although PSA is a prostate cancer screening test he was informed that cancer is not the most common cause of an elevated PSA. Other potential causes including BPH and inflammation were discussed.  He was informed that the only way to adequately diagnose prostate cancer would be transrectal ultrasound and biopsy of the prostate. The procedure was discussed  including potential risks of bleeding and infection/sepsis. He was also informed that a negative biopsy does not conclusively rule out the possibility that prostate cancer may be present and that continued monitoring is required.  The use of newer adjunctive blood and urine tests to predict the probability of high-grade prostate cancer were discussed. The use of multiparametric prostate MRI to evaluate for lesions suspicious for high grade prostate cancer and aid in targeted bx was reviewed.  Continued periodic surveillance was also discussed. Her last PSA was August 2024 and will repeat to make sure this is not a transient elevation. Repeat urinalysis today If PSA persistent elevated, recommend prostate MRI.  [facility] [address] [place], [place] [address] [phone]

## 2024-02-07 ENCOUNTER — Other Ambulatory Visit: Payer: Self-pay | Admitting: *Deleted

## 2024-02-07 DIAGNOSIS — R972 Elevated prostate specific antigen [PSA]: Secondary | ICD-10-CM

## 2024-02-07 LAB — PSA: Prostate Specific Ag, Serum: 4.9 ng/mL — ABNORMAL HIGH (ref 0.0–4.0)

## 2024-02-08 ENCOUNTER — Encounter: Payer: Self-pay | Admitting: Urology

## 2024-03-30 ENCOUNTER — Other Ambulatory Visit: Payer: Self-pay | Admitting: Family Medicine

## 2024-03-30 DIAGNOSIS — Z0000 Encounter for general adult medical examination without abnormal findings: Secondary | ICD-10-CM

## 2024-05-25 ENCOUNTER — Encounter: Payer: Self-pay | Admitting: Family Medicine

## 2024-09-20 ENCOUNTER — Ambulatory Visit: Admitting: Podiatry

## 2024-09-27 ENCOUNTER — Ambulatory Visit (INDEPENDENT_AMBULATORY_CARE_PROVIDER_SITE_OTHER): Admitting: Podiatry

## 2024-09-27 DIAGNOSIS — M2042 Other hammer toe(s) (acquired), left foot: Secondary | ICD-10-CM

## 2024-09-27 DIAGNOSIS — M2041 Other hammer toe(s) (acquired), right foot: Secondary | ICD-10-CM | POA: Diagnosis not present

## 2024-09-27 DIAGNOSIS — Z794 Long term (current) use of insulin: Secondary | ICD-10-CM

## 2024-09-27 DIAGNOSIS — E119 Type 2 diabetes mellitus without complications: Secondary | ICD-10-CM | POA: Diagnosis not present

## 2024-09-27 NOTE — Progress Notes (Signed)
  Subjective:  Patient ID: Austin Dudley, male    DOB: 1956-01-05,  MRN: [id number]  Chief Complaint  Patient presents with   Foot Pain    68 y.o. male presents with the above complaint.  Patient presents with complaint bilateral hammertoe deformities.  He states that he is a diabetic and is causing some issues.  He wanted to discuss diabetic shoes.  He has not seen anyone else prior to seeing me.  He tries to keep his diabetes under control.  He is having some swallowing issues.  He has not any open wounds.  Would like to discuss diabetic shoes.   Review of Systems: Negative except as noted in the HPI. Denies N/V/F/Ch.  Past Medical History:  Diagnosis Date   Chronic kidney disease    Vertigo     Current Outpatient Medications:    allopurinol  (ZYLOPRIM ) 100 MG tablet, Take 100 mg by mouth daily., Disp: , Rfl:    aspirin EC 81 MG tablet, Take 81 mg by mouth daily. Swallow whole., Disp: , Rfl:    atorvastatin  (LIPITOR) 10 MG tablet, Take 1 tablet by mouth once daily, Disp: 90 tablet, Rfl: 2   JARDIANCE 25 MG TABS tablet, Take 25 mg by mouth daily., Disp: , Rfl:    latanoprost  (XALATAN ) 0.005 % ophthalmic solution, Place 1 drop into both eyes at bedtime., Disp: , Rfl:    tamsulosin  (FLOMAX ) 0.4 MG CAPS capsule, Take 0.4 mg by mouth daily., Disp: , Rfl:   Social History   Tobacco Use  Smoking Status Some Days   Current packs/day: 0.00   Average packs/day: 0.3 packs/day for 47.0 years (11.8 ttl pk-yrs)   Types: Cigarettes   Start date: 06/27/1973   Last attempt to quit: 06/27/2020   Years since quitting: 4.2  Smokeless Tobacco Never  Tobacco Comments   Per pt he's coming down slowly    Allergies  Allergen Reactions   Amoxicillin Rash   Ampicillin Rash   Objective:  There were no vitals filed for this visit. There is no height or weight on file to calculate BMI. Constitutional Well developed. Well nourished.  Vascular Dorsalis pedis pulses palpable bilaterally. Posterior  tibial pulses palpable bilaterally. Capillary refill normal to all digits.  No cyanosis or clubbing noted. Pedal hair growth normal.  Neurologic Normal speech. Oriented to person, place, and time. Epicritic sensation to light touch grossly present bilaterally.  Dermatologic Nails well groomed and normal in appearance. No open wounds. No skin lesions.  Orthopedic: Bilateral hammertoe contracture with semiflexible nature of pain on palpation to the contracture.  No open wounds or lesion noted.  No signs of preulcerative callus noted   Radiographs: None Assessment:   1. Hammer toes of both feet   2. Type 2 diabetes mellitus without complication, with long-term current use of insulin  [name]    Plan:  Patient was evaluated and treated and all questions answered.  Bilateral hammertoes secondary to diabetes - All questions and concerns were discussed with the patient in extensive detail - Given the presence of bilateral fomites patient will benefit from diabetic shoes prescription for diabetic shoes was given.  He is at high risk for undergoing amputation if his dad believes regresses or if he develops a callus formation he states understanding  No follow-ups on file.
